# Patient Record
Sex: Female | Born: 1965 | Race: Black or African American | Hispanic: No | Smoking: Never smoker
Health system: Southern US, Community
[De-identification: ages and names within clinical notes are randomized; demographics above are authoritative.]

## PROBLEM LIST (undated history)

## (undated) DIAGNOSIS — D649 Anemia, unspecified: Secondary | ICD-10-CM

## (undated) DIAGNOSIS — T7840XA Allergy, unspecified, initial encounter: Secondary | ICD-10-CM

## (undated) DIAGNOSIS — N390 Urinary tract infection, site not specified: Secondary | ICD-10-CM

## (undated) DIAGNOSIS — K219 Gastro-esophageal reflux disease without esophagitis: Secondary | ICD-10-CM

## (undated) HISTORY — DX: Urinary tract infection, site not specified: N39.0

## (undated) HISTORY — DX: Allergy, unspecified, initial encounter: T78.40XA

## (undated) HISTORY — DX: Anemia, unspecified: D64.9

## (undated) HISTORY — DX: Gastro-esophageal reflux disease without esophagitis: K21.9

## (undated) HISTORY — PX: TUBAL LIGATION: SHX77

---
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
## 2004-12-31 ENCOUNTER — Ambulatory Visit: Payer: Self-pay | Admitting: Internal Medicine

## 2005-01-21 ENCOUNTER — Ambulatory Visit: Payer: Self-pay | Admitting: Internal Medicine

## 2010-10-13 ENCOUNTER — Emergency Department: Payer: Self-pay | Admitting: Emergency Medicine

## 2013-01-23 ENCOUNTER — Emergency Department: Payer: Self-pay | Admitting: Internal Medicine

## 2015-11-25 ENCOUNTER — Ambulatory Visit: Payer: Self-pay

## 2015-12-18 ENCOUNTER — Other Ambulatory Visit: Payer: Self-pay

## 2016-05-11 ENCOUNTER — Ambulatory Visit (INDEPENDENT_AMBULATORY_CARE_PROVIDER_SITE_OTHER): Payer: Commercial Managed Care - HMO | Admitting: Family

## 2016-05-11 ENCOUNTER — Encounter: Payer: Self-pay | Admitting: Family

## 2016-05-11 DIAGNOSIS — Z0000 Encounter for general adult medical examination without abnormal findings: Secondary | ICD-10-CM | POA: Diagnosis not present

## 2016-05-11 NOTE — Progress Notes (Signed)
Subjective:    Patient ID: Denise Kramer, female    DOB: February 21, 1966, 50 y.o.   MRN: [id number]  Chief Complaint  Patient presents with  . Establish Care    Not fasting    HPI:  Denise Kramer is a 50 y.o. female who presents today for an annual wellness visit.   1) Health Maintenance -   Diet - Averages about 3 meals per day with snacks consisting of a regular diet; Caffeine intake of 2-3 cups per day.   Exercise - No structured exercise   2) Preventative Exams / Immunizations:  Dental -- Due to exam  Vision -- Due for exam   Health Maintenance  Topic Date Due  . HIV Screening  03/09/1982  . PAP SMEAR  03/09/1988  . INFLUENZA VACCINE  05/07/2017 (Originally 03/23/2016)  . TETANUS/TDAP  02/13/2026     There is no immunization history on file for this patient.   Allergies  Allergen Reactions  . Aspirin Rash     No outpatient prescriptions prior to visit.   No facility-administered medications prior to visit.      Past Medical History:  Diagnosis Date  . Allergy   . GERD (gastroesophageal reflux disease)   . UTI (urinary tract infection)      Past Surgical History:  Procedure Laterality Date  . TUBAL LIGATION       Family History  Problem Relation Age of Onset  . Alcohol abuse Mother   . Hypertension Mother   . Healthy Father   . Hypertension Maternal Grandmother      Social History   Social History  . Marital status: Divorced    Spouse name: N/A  . Number of children: 3  . Years of education: 12   Occupational History  . Intake Specialist     Social History Main Topics  . Smoking status: Never Smoker  . Smokeless tobacco: Never Used  . Alcohol use No  . Drug use: No  . Sexual activity: Not on file   Other Topics Concern  . Not on file   Social History Narrative   Fun: Eat, shop, travel   Denies abuse and feels safe at home      Review of Systems  Constitutional: Denies fever, chills, fatigue, or significant weight  gain/loss. HENT: Head: Denies headache or neck pain Ears: Denies changes in hearing, ringing in ears, earache, drainage Nose: Denies discharge, stuffiness, itching, nosebleed, sinus pain Throat: Denies sore throat, hoarseness, dry mouth, sores, thrush Eyes: Denies loss/changes in vision, pain, redness, blurry/double vision, flashing lights Cardiovascular: Denies chest pain/discomfort, tightness, palpitations, shortness of breath with activity, difficulty lying down, swelling, sudden awakening with shortness of breath Respiratory: Denies shortness of breath, cough, sputum production, wheezing Gastrointestinal: Denies dysphasia, heartburn, change in appetite, nausea, change in bowel habits, rectal bleeding, constipation, diarrhea, yellow skin or eyes Genitourinary: Denies frequency, urgency, burning/pain, blood in urine, incontinence, change in urinary strength. Musculoskeletal: Denies muscle/joint pain, stiffness, back pain, redness or swelling of joints, trauma Skin: Denies rashes, lumps, itching, dryness, color changes, or hair/nail changes Neurological: Denies dizziness, fainting, seizures, weakness, numbness, tingling, tremor Psychiatric - Denies nervousness, stress, depression or memory loss Endocrine: Denies heat or cold intolerance, sweating, frequent urination, excessive thirst, changes in appetite Hematologic: Denies ease of bruising or bleeding     Objective:    BP 130/88 (BP Location: Left Arm, Patient Position: Sitting, Cuff Size: Normal)   Pulse 85   Temp 98.2 F (36.8 C) (Oral)  Resp 16   Ht 5\' 5"  (1.651 m)   Wt 163 lb (73.9 kg)   SpO2 98%   BMI 27.12 kg/m  Nursing note and vital signs reviewed.  Physical Exam  Constitutional: She is oriented to person, place, and time. She appears well-developed and well-nourished.  HENT:  Head: Normocephalic.  Right Ear: Hearing, tympanic membrane, external ear and ear canal normal.  Left Ear: Hearing, tympanic membrane, external  ear and ear canal normal.  Nose: Nose normal.  Mouth/Throat: Uvula is midline, oropharynx is clear and moist and mucous membranes are normal.  Eyes: Conjunctivae and EOM are normal. Pupils are equal, round, and reactive to light.  Neck: Neck supple. No JVD present. No tracheal deviation present. No thyromegaly present.  Cardiovascular: Normal rate, regular rhythm, normal heart sounds and intact distal pulses.   Pulmonary/Chest: Effort normal and breath sounds normal.  Abdominal: Soft. Bowel sounds are normal. She exhibits no distension and no mass. There is no tenderness. There is no rebound and no guarding.  Musculoskeletal: Normal range of motion. She exhibits no edema or tenderness.  Lymphadenopathy:    She has no cervical adenopathy.  Neurological: She is alert and oriented to person, place, and time. She has normal reflexes. No cranial nerve deficit. She exhibits normal muscle tone. Coordination normal.  Skin: Skin is warm and dry.  Psychiatric: She has a normal mood and affect. Her behavior is normal. Judgment and thought content normal.       Assessment & Plan:   Problem List Items Addressed This Visit      Other   Routine general medical examination at a health care facility    1) Anticipatory Guidance: Discussed importance of wearing a seatbelt while driving and not texting while driving; changing batteries in smoke detector at least once annually; wearing suntan lotion when outside; eating a balanced and moderate diet; getting physical activity at least 30 minutes per day.  2) Immunizations / Screenings / Labs:  Declines influenza. All other immunizations are up-to-date per recommendations. Due for dental and vision screen encouraged to be completed independently. Due for cervical cancer screening with referral to gynecology placed. Obtain vitamin D for vitamin D deficiency screening. All other screenings are up-to-date per recommendations. Obtain CBC, CMET, and lipid profile.      Overall well exam with risk factors for cardiovascular disease including overweight and sedentary lifestyle. Encouraged to in increase physical activity to goal of 30 minutes of moderate level activity daily. Continue other healthy lifestyle behaviors and choices. Follow-up prevention exam in 1 year. Follow-up office visit pending blood work as necessary.       Relevant Orders   CBC   Comprehensive metabolic panel   VITAMIN D 25 Hydroxy (Vit-D Deficiency, Fractures)   Lipid panel   Ambulatory referral to Gynecology    Other Visit Diagnoses   None.     Follow-up: Return if symptoms worsen or fail to improve.   Jeanine Luz, FNP

## 2016-05-11 NOTE — Assessment & Plan Note (Signed)
1) Anticipatory Guidance: Discussed importance of wearing a seatbelt while driving and not texting while driving; changing batteries in smoke detector at least once annually; wearing suntan lotion when outside; eating a balanced and moderate diet; getting physical activity at least 30 minutes per day.  2) Immunizations / Screenings / Labs:  Declines influenza. All other immunizations are up-to-date per recommendations. Due for dental and vision screen encouraged to be completed independently. Due for cervical cancer screening with referral to gynecology placed. Obtain vitamin D for vitamin D deficiency screening. All other screenings are up-to-date per recommendations. Obtain CBC, CMET, and lipid profile.    Overall well exam with risk factors for cardiovascular disease including overweight and sedentary lifestyle. Encouraged to in increase physical activity to goal of 30 minutes of moderate level activity daily. Continue other healthy lifestyle behaviors and choices. Follow-up prevention exam in 1 year. Follow-up office visit pending blood work as necessary.

## 2016-05-11 NOTE — Patient Instructions (Addendum)
Thank you for choosing [facility].  SUMMARY AND INSTRUCTIONS:  Labs:  Please stop by the lab on the lower level of the building for your blood work. Your results will be released to [place] (or called to you) after review, usually within 72 hours after test completion. If any changes need to be made, you will be notified at that same time.  1.) The lab is open from 7:30am to 5:30 pm Monday-Friday 2.) No appointment is necessary 3.) Fasting (if needed) is 6-8 hours after food and drink; black coffee and water are okay   Follow up:  If your symptoms worsen or fail to improve, please contact our office for further instruction, or in case of emergency go directly to the emergency room at the closest medical facility.    Health Maintenance, Female Adopting a healthy lifestyle and getting preventive care can go a long way to promote health and wellness. Talk with your health care provider about what schedule of regular examinations is right for you. This is a good chance for you to check in with your provider about disease prevention and staying healthy. In between checkups, there are plenty of things you can do on your own. Experts have done a lot of research about which lifestyle changes and preventive measures are most likely to keep you healthy. Ask your health care provider for more information. WEIGHT AND DIET  Eat a healthy diet  Be sure to include plenty of vegetables, fruits, low-fat dairy products, and lean protein.  Do not eat a lot of foods high in solid fats, added sugars, or salt.  Get regular exercise. This is one of the most important things you can do for your health.  Most adults should exercise for at least 150 minutes each week. The exercise should increase your heart rate and make you sweat (moderate-intensity exercise).  Most adults should also do strengthening exercises at least twice a week. This is in addition to the moderate-intensity exercise.  Maintain a  healthy weight  Body mass index (BMI) is a measurement that can be used to identify possible weight problems. It estimates body fat based on height and weight. Your health care provider can help determine your BMI and help you achieve or maintain a healthy weight.  For females 66 years of age and older:   A BMI below 18.5 is considered underweight.  A BMI of 18.5 to 24.9 is normal.  A BMI of 25 to 29.9 is considered overweight.  A BMI of 30 and above is considered obese.  Watch levels of cholesterol and blood lipids  You should start having your blood tested for lipids and cholesterol at 49 years of age, then have this test every 5 years.  You may need to have your cholesterol levels checked more often if:  Your lipid or cholesterol levels are high.  You are older than 49 years of age.  You are at high risk for heart disease.  CANCER SCREENING   Lung Cancer  Lung cancer screening is recommended for adults 48-12 years old who are at high risk for lung cancer because of a history of smoking.  A yearly low-dose CT scan of the lungs is recommended for people who:  Currently smoke.  Have quit within the past 15 years.  Have at least a 30-pack-year history of smoking. A pack year is smoking an average of one pack of cigarettes a day for 1 year.  Yearly screening should continue until it has been 15  years since you quit.  Yearly screening should stop if you develop a health problem that would prevent you from having lung cancer treatment.  Breast Cancer  Practice breast self-awareness. This means understanding how your breasts normally appear and feel.  It also means doing regular breast self-exams. Let your health care provider know about any changes, no matter how small.  If you are in your 20s or 30s, you should have a clinical breast exam (CBE) by a health care provider every 1-3 years as part of a regular health exam.  If you are 105 or older, have a CBE every year.  Also consider having a breast X-ray (mammogram) every year.  If you have a family history of breast cancer, talk to your health care provider about genetic screening.  If you are at high risk for breast cancer, talk to your health care provider about having an MRI and a mammogram every year.  Breast cancer gene (BRCA) assessment is recommended for women who have family members with BRCA-related cancers. BRCA-related cancers include:  Breast.  Ovarian.  Tubal.  Peritoneal cancers.  Results of the assessment will determine the need for genetic counseling and BRCA1 and BRCA2 testing. Cervical Cancer Your health care provider may recommend that you be screened regularly for cancer of the pelvic organs (ovaries, uterus, and vagina). This screening involves a pelvic examination, including checking for microscopic changes to the surface of your cervix (Pap test). You may be encouraged to have this screening done every 3 years, beginning at age 4.  For women ages 18-65, health care providers may recommend pelvic exams and Pap testing every 3 years, or they may recommend the Pap and pelvic exam, combined with testing for human papilloma virus (HPV), every 5 years. Some types of HPV increase your risk of cervical cancer. Testing for HPV may also be done on women of any age with unclear Pap test results.  Other health care providers may not recommend any screening for nonpregnant women who are considered low risk for pelvic cancer and who do not have symptoms. Ask your health care provider if a screening pelvic exam is right for you.  If you have had past treatment for cervical cancer or a condition that could lead to cancer, you need Pap tests and screening for cancer for at least 20 years after your treatment. If Pap tests have been discontinued, your risk factors (such as having a new sexual partner) need to be reassessed to determine if screening should resume. Some women have medical problems that  increase the chance of getting cervical cancer. In these cases, your health care provider may recommend more frequent screening and Pap tests. Colorectal Cancer  This type of cancer can be detected and often prevented.  Routine colorectal cancer screening usually begins at 49 years of age and continues through 49 years of age.  Your health care provider may recommend screening at an earlier age if you have risk factors for colon cancer.  Your health care provider may also recommend using home test kits to check for hidden blood in the stool.  A small camera at the end of a tube can be used to examine your colon directly (sigmoidoscopy or colonoscopy). This is done to check for the earliest forms of colorectal cancer.  Routine screening usually begins at age 33.  Direct examination of the colon should be repeated every 5-10 years through 49 years of age. However, you may need to be screened more often if early  forms of precancerous polyps or small growths are found. Skin Cancer  Check your skin from head to toe regularly.  Tell your health care provider about any new moles or changes in moles, especially if there is a change in a mole's shape or color.  Also tell your health care provider if you have a mole that is larger than the size of a pencil eraser.  Always use sunscreen. Apply sunscreen liberally and repeatedly throughout the day.  Protect yourself by wearing long sleeves, pants, a wide-brimmed hat, and sunglasses whenever you are outside. HEART DISEASE, DIABETES, AND HIGH BLOOD PRESSURE   High blood pressure causes heart disease and increases the risk of stroke. High blood pressure is more likely to develop in:  People who have blood pressure in the high end of the normal range (130-139/85-89 mm Hg).  People who are overweight or obese.  People who are African American.  If you are 62-72 years of age, have your blood pressure checked every 3-5 years. If you are 35 years of  age or older, have your blood pressure checked every year. You should have your blood pressure measured twice--once when you are at a hospital or clinic, and once when you are not at a hospital or clinic. Record the average of the two measurements. To check your blood pressure when you are not at a hospital or clinic, you can use:  An automated blood pressure machine at a pharmacy.  A home blood pressure monitor.  If you are between 67 years and 50 years old, ask your health care provider if you should take aspirin to prevent strokes.  Have regular diabetes screenings. This involves taking a blood sample to check your fasting blood sugar level.  If you are at a normal weight and have a low risk for diabetes, have this test once every three years after 49 years of age.  If you are overweight and have a high risk for diabetes, consider being tested at a younger age or more often. PREVENTING INFECTION  Hepatitis B  If you have a higher risk for hepatitis B, you should be screened for this virus. You are considered at high risk for hepatitis B if:  You were born in a country where hepatitis B is common. Ask your health care provider which countries are considered high risk.  Your parents were born in a high-risk country, and you have not been immunized against hepatitis B (hepatitis B vaccine).  You have HIV or AIDS.  You use needles to inject street drugs.  You live with someone who has hepatitis B.  You have had sex with someone who has hepatitis B.  You get hemodialysis treatment.  You take certain medicines for conditions, including cancer, organ transplantation, and autoimmune conditions. Hepatitis C  Blood testing is recommended for:  Everyone born from [birth year] through 1964.  Anyone with known risk factors for hepatitis C. Sexually transmitted infections (STIs)  You should be screened for sexually transmitted infections (STIs) including gonorrhea and chlamydia if:  You are  sexually active and are younger than 49 years of age.  You are older than 49 years of age and your health care provider tells you that you are at risk for this type of infection.  Your sexual activity has changed since you were last screened and you are at an increased risk for chlamydia or gonorrhea. Ask your health care provider if you are at risk.  If you do not have HIV, but are  at risk, it may be recommended that you take a prescription medicine daily to prevent HIV infection. This is called pre-exposure prophylaxis (PrEP). You are considered at risk if:  You are sexually active and do not regularly use condoms or know the HIV status of your partner(s).  You take drugs by injection.  You are sexually active with a partner who has HIV. Talk with your health care provider about whether you are at high risk of being infected with HIV. If you choose to begin PrEP, you should first be tested for HIV. You should then be tested every 3 months for as long as you are taking PrEP.  PREGNANCY   If you are premenopausal and you may become pregnant, ask your health care provider about preconception counseling.  If you may become pregnant, take 400 to 800 micrograms (mcg) of folic acid every day.  If you want to prevent pregnancy, talk to your health care provider about birth control (contraception). OSTEOPOROSIS AND MENOPAUSE   Osteoporosis is a disease in which the bones lose minerals and strength with aging. This can result in serious bone fractures. Your risk for osteoporosis can be identified using a bone density scan.  If you are 25 years of age or older, or if you are at risk for osteoporosis and fractures, ask your health care provider if you should be screened.  Ask your health care provider whether you should take a calcium or vitamin D supplement to lower your risk for osteoporosis.  Menopause may have certain physical symptoms and risks.  Hormone replacement therapy may reduce some  of these symptoms and risks. Talk to your health care provider about whether hormone replacement therapy is right for you.  HOME CARE INSTRUCTIONS   Schedule regular health, dental, and eye exams.  Stay current with your immunizations.   Do not use any tobacco products including cigarettes, chewing tobacco, or electronic cigarettes.  If you are pregnant, do not drink alcohol.  If you are breastfeeding, limit how much and how often you drink alcohol.  Limit alcohol intake to no more than 1 drink per day for nonpregnant women. One drink equals 12 ounces of beer, 5 ounces of wine, or 1 ounces of hard liquor.  Do not use street drugs.  Do not share needles.  Ask your health care provider for help if you need support or information about quitting drugs.  Tell your health care provider if you often feel depressed.  Tell your health care provider if you have ever been abused or do not feel safe at home.   This information is not intended to replace advice given to you by your health care provider. Make sure you discuss any questions you have with your health care provider.   Document Released: 02/22/2011 Document Revised: 08/30/2014 Document Reviewed: 07/11/2013 Elsevier Interactive Patient Education [employer].

## 2016-05-14 ENCOUNTER — Other Ambulatory Visit (INDEPENDENT_AMBULATORY_CARE_PROVIDER_SITE_OTHER): Payer: Commercial Managed Care - HMO

## 2016-05-14 DIAGNOSIS — Z0000 Encounter for general adult medical examination without abnormal findings: Secondary | ICD-10-CM | POA: Diagnosis not present

## 2016-05-14 LAB — COMPREHENSIVE METABOLIC PANEL
ALBUMIN: 3.5 g/dL (ref 3.5–5.2)
ALK PHOS: 58 U/L (ref 39–117)
ALT: 8 U/L (ref 0–35)
AST: 14 U/L (ref 0–37)
BILIRUBIN TOTAL: 0.5 mg/dL (ref 0.2–1.2)
BUN: 13 mg/dL (ref 6–23)
CO2: 30 mEq/L (ref 19–32)
CREATININE: 1.02 mg/dL (ref 0.40–1.20)
Calcium: 8.7 mg/dL (ref 8.4–10.5)
Chloride: 104 mEq/L (ref 96–112)
GFR: 74.02 mL/min (ref >60.00)
GLUCOSE: 79 mg/dL (ref 70–99)
Potassium: 3.8 mEq/L (ref 3.5–5.1)
SODIUM: 140 meq/L (ref 135–145)
TOTAL PROTEIN: 7.2 g/dL (ref 6.0–8.3)

## 2016-05-14 LAB — CBC
HCT: 33.7 % — ABNORMAL LOW (ref 36.0–46.0)
Hemoglobin: 11.5 g/dL — ABNORMAL LOW (ref 12.0–15.0)
MCHC: 34.1 g/dL (ref 30.0–36.0)
MCV: 89 fl (ref 78.0–100.0)
Platelets: 209 10*3/uL (ref 150.0–400.0)
RBC: 3.78 Mil/uL — ABNORMAL LOW (ref 3.87–5.11)
RDW: 14.2 % (ref 11.5–15.5)
WBC: 3.8 10*3/uL — ABNORMAL LOW (ref 4.0–10.5)

## 2016-05-14 LAB — LIPID PANEL
Cholesterol: 178 mg/dL (ref 0–200)
HDL: 57.5 mg/dL (ref >39.00)
LDL Cholesterol: 106 mg/dL — ABNORMAL HIGH (ref 0–99)
NonHDL: 120.17
Total CHOL/HDL Ratio: 3
Triglycerides: 71 mg/dL (ref 0.0–149.0)
VLDL: 14.2 mg/dL (ref 0.0–40.0)

## 2016-05-14 LAB — VITAMIN D 25 HYDROXY (VIT D DEFICIENCY, FRACTURES): VITD: 29.25 ng/mL — ABNORMAL LOW (ref 30.00–100.00)

## 2017-03-01 ENCOUNTER — Other Ambulatory Visit: Payer: Self-pay | Admitting: Adult Health Nurse Practitioner

## 2017-07-22 ENCOUNTER — Other Ambulatory Visit (INDEPENDENT_AMBULATORY_CARE_PROVIDER_SITE_OTHER): Payer: 59

## 2017-07-22 ENCOUNTER — Encounter: Payer: Self-pay | Admitting: Nurse Practitioner

## 2017-07-22 ENCOUNTER — Encounter: Payer: Self-pay | Admitting: Gastroenterology

## 2017-07-22 ENCOUNTER — Ambulatory Visit (INDEPENDENT_AMBULATORY_CARE_PROVIDER_SITE_OTHER): Payer: 59 | Admitting: Nurse Practitioner

## 2017-07-22 VITALS — BP 128/88 | HR 60 | Temp 98.1°F | Resp 14 | Ht 65.0 in | Wt 170.0 lb

## 2017-07-22 DIAGNOSIS — Z13220 Encounter for screening for lipoid disorders: Secondary | ICD-10-CM

## 2017-07-22 DIAGNOSIS — Z1211 Encounter for screening for malignant neoplasm of colon: Secondary | ICD-10-CM

## 2017-07-22 DIAGNOSIS — Z114 Encounter for screening for human immunodeficiency virus [HIV]: Secondary | ICD-10-CM | POA: Diagnosis not present

## 2017-07-22 DIAGNOSIS — Z0000 Encounter for general adult medical examination without abnormal findings: Secondary | ICD-10-CM

## 2017-07-22 DIAGNOSIS — Z1231 Encounter for screening mammogram for malignant neoplasm of breast: Secondary | ICD-10-CM

## 2017-07-22 DIAGNOSIS — D649 Anemia, unspecified: Secondary | ICD-10-CM

## 2017-07-22 DIAGNOSIS — Z1239 Encounter for other screening for malignant neoplasm of breast: Secondary | ICD-10-CM

## 2017-07-22 LAB — LIPID PANEL
CHOLESTEROL: 206 mg/dL — AB (ref 0–200)
HDL: 56.8 mg/dL (ref >39.00)
LDL Cholesterol: 134 mg/dL — ABNORMAL HIGH (ref 0–99)
NONHDL: 149.29
Total CHOL/HDL Ratio: 4
Triglycerides: 75 mg/dL (ref 0.0–149.0)
VLDL: 15 mg/dL (ref 0.0–40.0)

## 2017-07-22 LAB — CBC
HCT: 35.1 % — ABNORMAL LOW (ref 36.0–46.0)
Hemoglobin: 11.8 g/dL — ABNORMAL LOW (ref 12.0–15.0)
MCHC: 33.7 g/dL (ref 30.0–36.0)
MCV: 89.6 fl (ref 78.0–100.0)
Platelets: 207 10*3/uL (ref 150.0–400.0)
RBC: 3.92 Mil/uL (ref 3.87–5.11)
RDW: 14 % (ref 11.5–15.5)
WBC: 4.3 10*3/uL (ref 4.0–10.5)

## 2017-07-22 NOTE — Patient Instructions (Addendum)
Please head downstairs for lab work.  I have placed a referral for colonoscopy and mammograms. Our office will call you to schedule this appointment. You should hear from our office in 7-10 days.  Please work on your diet and exercise as we discussed. Remember half of your plate should be veggies, one-fourth carbs, one-fourth meat, and don't eat meat at every meal. Also, remember to stay away from sugary drinks. I'd like for you to start incorporating exercise into your daily schedule. Start at 10 minutes a day, working up to 30 minutes five times a week.   Ill see you back in 1 year, or sooner if you need me.  It was nice to meet you. Thanks for letting me take care of you today :)  Preventive Care 40-64 Years, Female Preventive care refers to lifestyle choices and visits with your health care provider that can promote health and wellness. What does preventive care include?  A yearly physical exam. This is also called an annual well check.  Dental exams once or twice a year.  Routine eye exams. Ask your health care provider how often you should have your eyes checked.  Personal lifestyle choices, including: ? Daily care of your teeth and gums. ? Regular physical activity. ? Eating a healthy diet. ? Avoiding tobacco and drug use. ? Limiting alcohol use. ? Practicing safe sex. ? Taking low-dose aspirin daily starting at age 61. ? Taking vitamin and mineral supplements as recommended by your health care provider. What happens during an annual well check? The services and screenings done by your health care provider during your annual well check will depend on your age, overall health, lifestyle risk factors, and family history of disease. Counseling Your health care provider may ask you questions about your:  Alcohol use.  Tobacco use.  Drug use.  Emotional well-being.  Home and relationship well-being.  Sexual activity.  Eating habits.  Work and work  Statistician.  Method of birth control.  Menstrual cycle.  Pregnancy history.  Screening You may have the following tests or measurements:  Height, weight, and BMI.  Blood pressure.  Lipid and cholesterol levels. These may be checked every 5 years, or more frequently if you are over 38 years old.  Skin check.  Lung cancer screening. You may have this screening every year starting at age 5 if you have a 30-pack-year history of smoking and currently smoke or have quit within the past 15 years.  Fecal occult blood test (FOBT) of the stool. You may have this test every year starting at age 8.  Flexible sigmoidoscopy or colonoscopy. You may have a sigmoidoscopy every 5 years or a colonoscopy every 10 years starting at age 85.  Hepatitis C blood test.  Hepatitis B blood test.  Sexually transmitted disease (STD) testing.  Diabetes screening. This is done by checking your blood sugar (glucose) after you have not eaten for a while (fasting). You may have this done every 1-3 years.  Mammogram. This may be done every 1-2 years. Talk to your health care provider about when you should start having regular mammograms. This may depend on whether you have a family history of breast cancer.  BRCA-related cancer screening. This may be done if you have a family history of breast, ovarian, tubal, or peritoneal cancers.  Pelvic exam and Pap test. This may be done every 3 years starting at age 26. Starting at age 55, this may be done every 5 years if you have a Pap  test in combination with an HPV test.  Bone density scan. This is done to screen for osteoporosis. You may have this scan if you are at high risk for osteoporosis.  Discuss your test results, treatment options, and if necessary, the need for more tests with your health care provider. Vaccines Your health care provider may recommend certain vaccines, such as:  Influenza vaccine. This is recommended every year.  Tetanus,  diphtheria, and acellular pertussis (Tdap, Td) vaccine. You may need a Td booster every 10 years.  Varicella vaccine. You may need this if you have not been vaccinated.  Zoster vaccine. You may need this after age 8.  Measles, mumps, and rubella (MMR) vaccine. You may need at least one dose of MMR if you were born in 1957 or later. You may also need a second dose.  Pneumococcal 13-valent conjugate (PCV13) vaccine. You may need this if you have certain conditions and were not previously vaccinated.  Pneumococcal polysaccharide (PPSV23) vaccine. You may need one or two doses if you smoke cigarettes or if you have certain conditions.  Meningococcal vaccine. You may need this if you have certain conditions.  Hepatitis A vaccine. You may need this if you have certain conditions or if you travel or work in places where you may be exposed to hepatitis A.  Hepatitis B vaccine. You may need this if you have certain conditions or if you travel or work in places where you may be exposed to hepatitis B.  Haemophilus influenzae type b (Hib) vaccine. You may need this if you have certain conditions.  Talk to your health care provider about which screenings and vaccines you need and how often you need them. This information is not intended to replace advice given to you by your health care provider. Make sure you discuss any questions you have with your health care provider. Document Released: 09/05/2015 Document Revised: 04/28/2016 Document Reviewed: 06/10/2015 Elsevier Interactive Patient Education  2017 [employer].

## 2017-07-22 NOTE — Progress Notes (Signed)
Subjective:    Patient ID: Denise Kramer, female    DOB: Oct 11, 1965, 51 y.o.   MRN: [id number]  HPI Denise Kramer is a 51 yo female who presents today to establish care. She is transferring to me from another provider in the same clinic. Patient presents today for complete physical.  Immunizations: Flu-up to date TDAP- up to date Colonoscopy: referral today Pap Smear: follows with GYN Mammogram: referral today Smoker: never Vision: annual Dental: annual Diet: Breakfast-biscuit, cookie Lunch- eats lunch out Dinner-cooks at home Veggies almost days; Fruit daily Drinks green tea, soda, some water Exercise: Does not exercise at gym Sunscreen sometimes seatbelt yes  Review of Systems  Constitutional: Negative.  Negative for activity change and appetite change.  HENT: Negative for dental problem and voice change.   Eyes: Negative for visual disturbance.  Respiratory: Negative for cough and shortness of breath.   Cardiovascular: Negative for chest pain and palpitations.  Gastrointestinal: Negative for constipation and diarrhea.  Endocrine: Negative for cold intolerance and heat intolerance.  Genitourinary: Negative for difficulty urinating and hematuria.  Musculoskeletal: Negative for arthralgias and myalgias.  Skin: Negative for rash.  Allergic/Immunologic: Negative for environmental allergies and food allergies.  Neurological: Negative for speech difficulty and headaches.  Hematological: Does not bruise/bleed easily.  Psychiatric/Behavioral:       Negative for depression or anxiety.    Past Medical History:  Diagnosis Date  . Allergy   . GERD (gastroesophageal reflux disease)   . UTI (urinary tract infection)      Social History   Socioeconomic History  . Marital status: Divorced    Spouse name: Not on file  . Number of children: 3  . Years of education: 2212  . Highest education level: Not on file  Social Needs  . Financial resource strain: Not on file  . Food  insecurity - worry: Not on file  . Food insecurity - inability: Not on file  . Transportation needs - medical: Not on file  . Transportation needs - non-medical: Not on file  Occupational History  . Occupation: Intake Specialist   Tobacco Use  . Smoking status: Never Smoker  . Smokeless tobacco: Never Used  Substance and Sexual Activity  . Alcohol use: No  . Drug use: No  . Sexual activity: Not on file  Other Topics Concern  . Not on file  Social History Narrative   Fun: Eat, shop, travel   Denies abuse and feels safe at home    Past Surgical History:  Procedure Laterality Date  . TUBAL LIGATION      Family History  Problem Relation Age of Onset  . Alcohol abuse Mother   . Hypertension Mother   . Healthy Father   . Hypertension Maternal Grandmother     Allergies  Allergen Reactions  . Aspirin Rash    Current Outpatient Medications on File Prior to Visit  Medication Sig Dispense Refill  . Multiple Vitamin (MULTIVITAMIN) tablet Take 1 tablet by mouth daily.     No current facility-administered medications on file prior to visit.        Objective:   Physical Exam BP 128/88 (BP Location: Left Arm, Patient Position: Sitting, Cuff Size: Normal)   Pulse 60   Temp 98.1 F (36.7 C) (Oral)   Resp 14   Ht 5\' 5"  (1.651 m)   Wt 170 lb (77.1 kg)   BMI 28.29 kg/m   General Appearance:    Alert, cooperative, no distress, appears stated age  Head:    Normocephalic, without obvious abnormality, atraumatic  Eyes:    PERRL, conjunctiva/corneas clear, EOM's intact  Ears:    Normal right TM and external ear canal, left TM: mid ear effusion, normal left external ear canal   Nose:   Nares normal, septum midline, mucosa normal, no drainage    or sinus tenderness  Throat:   Lips, mucosa, and tongue normal; teeth and gums normal  Neck:   Supple, symmetrical, trachea midline, no adenopathy;    thyroid:  no enlargement/tenderness/nodules  Back:     Symmetric, no curvature, ROM  normal, no CVA tenderness  Lungs:     Clear to auscultation bilaterally, respirations unlabored  Chest Wall:    No tenderness or deformity   Heart:    Regular rate and rhythm, heart sounds normal, no murmur     Abdomen:     Soft, non-tender, bowel sounds active all four quadrants,    no masses, no organomegaly        Extremities:   Extremities normal, atraumatic, no cyanosis or edema  Pulses:   2+ and symmetric all extremities  Skin:   Skin color, texture, turgor normal, no rashes or lesions  Lymph nodes:   Cervical, supraclavicular, and axillary nodes normal  Neurologic:   CNII-XII intact, normal strength, sensation and reflexes    Throughout Psychiatric: normal mood, behavior, judgement         Assessment & Plan:

## 2017-07-22 NOTE — Assessment & Plan Note (Signed)
Anemia, unspecified type-maintained on OTC multivitamin with past labs indicating mild anemia. CBC today

## 2017-07-22 NOTE — Assessment & Plan Note (Signed)
Screening for HIV (human immunodeficiency virus) HIV antibody today Screening for colon cancer Ambulatory referral to Gastroenterology Screening for breast cancer MM DIGITAL SCREENING BILATERAL; Future Immunizations up to date. Follows GYN for pap. Health maintenance up to date.  Healthy diet and exercise discussed including reduced meat and 1/2 plate of veggies at meals, getting 150 minutes of exercise per week. Sunscreen, seatbelt dicussed  Screening cholesterol level - Lipid panel today  Preventive care handout given today

## 2017-07-23 LAB — HIV ANTIBODY (ROUTINE TESTING W REFLEX): HIV: NONREACTIVE

## 2017-08-24 ENCOUNTER — Ambulatory Visit
Admission: RE | Admit: 2017-08-24 | Discharge: 2017-08-24 | Disposition: A | Discharge status: Home / Self Care | Payer: 59 | Source: Ambulatory Visit | Attending: Nurse Practitioner | Admitting: Nurse Practitioner

## 2017-08-24 DIAGNOSIS — Z0000 Encounter for general adult medical examination without abnormal findings: Secondary | ICD-10-CM

## 2017-08-24 DIAGNOSIS — Z1239 Encounter for other screening for malignant neoplasm of breast: Secondary | ICD-10-CM

## 2017-09-01 ENCOUNTER — Ambulatory Visit (AMBULATORY_SURGERY_CENTER): Payer: Self-pay | Admitting: *Deleted

## 2017-09-01 ENCOUNTER — Other Ambulatory Visit: Payer: Self-pay

## 2017-09-01 VITALS — Ht 64.0 in | Wt 169.0 lb

## 2017-09-01 DIAGNOSIS — Z1211 Encounter for screening for malignant neoplasm of colon: Secondary | ICD-10-CM

## 2017-09-01 MED ORDER — SUPREP BOWEL PREP KIT 17.5-3.13-1.6 GM/177ML PO SOLN: 1.0000 | Freq: Once | ORAL | 0 refills | Status: AC

## 2017-09-01 NOTE — Progress Notes (Signed)
Patient denies any allergies to egg or soy products. Patient denies complications with anesthesia/sedation.  Patient denies oxygen use at home and denies diet medications.  Pamphlet given for colonoscopy. 

## 2017-09-07 ENCOUNTER — Encounter: Payer: Self-pay | Admitting: Gastroenterology

## 2017-09-15 ENCOUNTER — Encounter: Payer: Self-pay | Admitting: Gastroenterology

## 2017-09-15 ENCOUNTER — Other Ambulatory Visit: Payer: Self-pay

## 2017-09-15 ENCOUNTER — Ambulatory Visit (AMBULATORY_SURGERY_CENTER): Payer: 59 | Admitting: Gastroenterology

## 2017-09-15 VITALS — BP 119/87 | HR 72 | Temp 98.6°F | Resp 12 | Ht 64.0 in | Wt 169.0 lb

## 2017-09-15 DIAGNOSIS — Z1211 Encounter for screening for malignant neoplasm of colon: Secondary | ICD-10-CM | POA: Diagnosis not present

## 2017-09-15 DIAGNOSIS — Z1212 Encounter for screening for malignant neoplasm of rectum: Secondary | ICD-10-CM | POA: Diagnosis not present

## 2017-09-15 MED ORDER — SODIUM CHLORIDE 0.9 % IV SOLN: 500.0000 mL | Freq: Once | INTRAVENOUS | Status: AC

## 2017-09-15 NOTE — Progress Notes (Signed)
Report given to PACU, vss 

## 2017-09-15 NOTE — Progress Notes (Signed)
No soy or egg allegyPt's states no medical or surgical changes since previsit or office visit 

## 2017-09-15 NOTE — Patient Instructions (Signed)
Discharge instructions given. Normal exam. Resume previous medications. YOU HAD AN ENDOSCOPIC PROCEDURE TODAY AT THE [facility]:   Refer to the procedure report that was given to you for any specific questions about what was found during the examination.  If the procedure report does not answer your questions, please call your gastroenterologist to clarify.  If you requested that your care partner not be given the details of your procedure findings, then the procedure report has been included in a sealed envelope for you to review at your convenience later.  YOU SHOULD EXPECT: Some feelings of bloating in the abdomen. Passage of more gas than usual.  Walking can help get rid of the air that was put into your GI tract during the procedure and reduce the bloating. If you had a lower endoscopy (such as a colonoscopy or flexible sigmoidoscopy) you may notice spotting of blood in your stool or on the toilet paper. If you underwent a bowel prep for your procedure, you may not have a normal bowel movement for a few days.  Please Note:  You might notice some irritation and congestion in your nose or some drainage.  This is from the oxygen used during your procedure.  There is no need for concern and it should clear up in a day or so.  SYMPTOMS TO REPORT IMMEDIATELY:   Following lower endoscopy (colonoscopy or flexible sigmoidoscopy):  Excessive amounts of blood in the stool  Significant tenderness or worsening of abdominal pains  Swelling of the abdomen that is new, acute  Fever of 100F or higher   For urgent or emergent issues, a gastroenterologist can be reached at any hour by calling [phone].   DIET:  We do recommend a small meal at first, but then you may proceed to your regular diet.  Drink plenty of fluids but you should avoid alcoholic beverages for 24 hours.  ACTIVITY:  You should plan to take it easy for the rest of today and you should NOT DRIVE or use heavy machinery  until tomorrow (because of the sedation medicines used during the test).    FOLLOW UP: Our staff will call the number listed on your records the next business day following your procedure to check on you and address any questions or concerns that you may have regarding the information given to you following your procedure. If we do not reach you, we will leave a message.  However, if you are feeling well and you are not experiencing any problems, there is no need to return our call.  We will assume that you have returned to your regular daily activities without incident.  If any biopsies were taken you will be contacted by phone or by letter within the next 1-3 weeks.  Please call us at [phone] if you have not heard about the biopsies in 3 weeks.    SIGNATURES/CONFIDENTIALITY: You and/or your care partner have signed paperwork which will be entered into your electronic medical record.  These signatures attest to the fact that that the information above on your After Visit Summary has been reviewed and is understood.  Full responsibility of the confidentiality of this discharge information lies with you and/or your care-partner. 

## 2017-09-15 NOTE — Op Note (Signed)
[facility] Patient Name: Denise Kramer Procedure Date: 09/15/2017 11:08 AM MRN: [id number] Endoscopist: Sherilyn CooterHenry L. Myrtie Neitheranis , MD Age: [age] Referring MD:  Date of Birth: 01/01/1966 Gender: Female Account #: [id number] Procedure:                Colonoscopy Indications:              Screening for colorectal malignant neoplasm, This                            is the patient's first colonoscopy Medicines:                Monitored Anesthesia Care Procedure:                Pre-Anesthesia Assessment:                           - Prior to the procedure, a History and Physical                            was performed, and patient medications and                            allergies were reviewed. The patient's tolerance of                            previous anesthesia was also reviewed. The risks                            and benefits of the procedure and the sedation                            options and risks were discussed with the patient.                            All questions were answered, and informed consent                            was obtained. Prior Anticoagulants: The patient has                            taken no previous anticoagulant or antiplatelet                            agents. ASA Grade Assessment: I - A normal, healthy                            patient. After reviewing the risks and benefits,                            the patient was deemed in satisfactory condition to                            undergo the procedure.  After obtaining informed consent, the colonoscope                            was passed under direct vision. Throughout the                            procedure, the patient's blood pressure, pulse, and                            oxygen saturations were monitored continuously. The                            Colonoscope was introduced through the anus and                            advanced to the the cecum, identified  by                            appendiceal orifice and ileocecal valve. The                            colonoscopy was performed without difficulty. The                            patient tolerated the procedure well. The quality                            of the bowel preparation was excellent. The                            ileocecal valve, appendiceal orifice, and rectum                            were photographed. The quality of the bowel                            preparation was evaluated using the BBPS [facility]                            Bowel Preparation Scale) with scores of: Right                            Colon = 3, Transverse Colon = 3 and Left Colon = 3                            (entire mucosa seen well with no residual staining,                            small fragments of stool or opaque liquid). The                            total BBPS score equals 9. The bowel preparation  used was SUPREP. Scope In: 11:12:12 AM Scope Out: 11:25:06 AM Scope Withdrawal Time: 0 hours 10 minutes 0 seconds  Total Procedure Duration: 0 hours 12 minutes 54 seconds  Findings:                 The perianal and digital rectal examinations were                            normal.                           The entire examined colon appeared normal.                           Retroflexion in the rectum was not performed due to                            anatomy. Complications:            No immediate complications. Estimated Blood Loss:     Estimated blood loss: none. Impression:               - The entire examined colon is normal.                           - No specimens collected. Recommendation:           - Patient has a contact number available for                            emergencies. The signs and symptoms of potential                            delayed complications were discussed with the                            patient. Return to normal activities tomorrow.                             Written discharge instructions were provided to the                            patient.                           - Resume previous diet.                           - Continue present medications.                           - Repeat colonoscopy in 10 years for screening                            purposes. Maxden Naji L. Myrtie Neither, MD 09/15/2017 11:27:50 AM This report has been signed electronically.

## 2017-09-16 ENCOUNTER — Telehealth: Payer: Self-pay

## 2017-09-16 NOTE — Telephone Encounter (Signed)
  Follow up Call-  Call back number 09/15/2017  Post procedure Call Back phone  # [phone]  Permission to leave phone message Yes  Some recent data might be hidden     Patient questions:  Do you have a fever, pain , or abdominal swelling? No. Pain Score  0 *  Have you tolerated food without any problems? Yes.    Have you been able to return to your normal activities? Yes.    Do you have any questions about your discharge instructions: Diet   No. Medications  No. Follow up visit  No.  Do you have questions or concerns about your Care? No.  Actions: * If pain score is 4 or above: No action needed, pain <4.

## 2018-05-24 ENCOUNTER — Other Ambulatory Visit (INDEPENDENT_AMBULATORY_CARE_PROVIDER_SITE_OTHER): Payer: 59

## 2018-05-24 ENCOUNTER — Ambulatory Visit: Payer: 59 | Admitting: Nurse Practitioner

## 2018-05-24 ENCOUNTER — Encounter: Payer: Self-pay | Admitting: Nurse Practitioner

## 2018-05-24 VITALS — BP 142/88 | HR 65 | Ht 64.0 in | Wt 171.0 lb

## 2018-05-24 DIAGNOSIS — E785 Hyperlipidemia, unspecified: Secondary | ICD-10-CM

## 2018-05-24 DIAGNOSIS — R030 Elevated blood-pressure reading, without diagnosis of hypertension: Secondary | ICD-10-CM | POA: Diagnosis not present

## 2018-05-24 DIAGNOSIS — Z23 Encounter for immunization: Secondary | ICD-10-CM | POA: Diagnosis not present

## 2018-05-24 LAB — TSH: TSH: 0.97 u[IU]/mL (ref 0.35–4.50)

## 2018-05-24 LAB — COMPREHENSIVE METABOLIC PANEL
ALT: 10 U/L (ref 0–35)
AST: 15 U/L (ref 0–37)
Albumin: 4 g/dL (ref 3.5–5.2)
Alkaline Phosphatase: 91 U/L (ref 39–117)
BILIRUBIN TOTAL: 0.5 mg/dL (ref 0.2–1.2)
BUN: 13 mg/dL (ref 6–23)
CHLORIDE: 103 meq/L (ref 96–112)
CO2: 31 mEq/L (ref 19–32)
CREATININE: 1.03 mg/dL (ref 0.40–1.20)
Calcium: 9.3 mg/dL (ref 8.4–10.5)
GFR: 72.59 mL/min (ref >60.00)
Glucose, Bld: 90 mg/dL (ref 70–99)
Potassium: 3.7 mEq/L (ref 3.5–5.1)
Sodium: 140 mEq/L (ref 135–145)
TOTAL PROTEIN: 7.8 g/dL (ref 6.0–8.3)

## 2018-05-24 LAB — LIPID PANEL
CHOLESTEROL: 183 mg/dL (ref 0–200)
HDL: 49.2 mg/dL (ref >39.00)
LDL Cholesterol: 116 mg/dL — ABNORMAL HIGH (ref 0–99)
NonHDL: 133.99
Total CHOL/HDL Ratio: 4
Triglycerides: 89 mg/dL (ref 0.0–149.0)
VLDL: 17.8 mg/dL (ref 0.0–40.0)

## 2018-05-24 NOTE — Assessment & Plan Note (Signed)
She is fasting Update labs F/U with further recommendations pending lab results - Lipid panel; Future

## 2018-05-24 NOTE — Patient Instructions (Addendum)
Please head downstairs for lab work. If any of your test results are critically abnormal, you will be contacted right away. Otherwise, I will contact you within a week about your test results and follow up recommendations  Please try to check your blood pressure once daily or at least a few times a week, at the same time each day, and keep a log. Please return in about 1 month with BP readings. The goal is for your blood pressure to be less than 140/90 at all times   DASH Eating Plan DASH stands for "Dietary Approaches to Stop Hypertension." The DASH eating plan is a healthy eating plan that has been shown to reduce high blood pressure (hypertension). It may also reduce your risk for type 2 diabetes, heart disease, and stroke. The DASH eating plan may also help with weight loss. What are tips for following this plan? General guidelines  Avoid eating more than 2,300 mg (milligrams) of salt (sodium) a day. If you have hypertension, you may need to reduce your sodium intake to 1,500 mg a day.  Limit alcohol intake to no more than 1 drink a day for nonpregnant women and 2 drinks a day for men. One drink equals 12 oz of beer, 5 oz of wine, or 1 oz of hard liquor.  Work with your health care provider to maintain a healthy body weight or to lose weight. Ask what an ideal weight is for you.  Get at least 30 minutes of exercise that causes your heart to beat faster (aerobic exercise) most days of the week. Activities may include walking, swimming, or biking.  Work with your health care provider or diet and nutrition specialist (dietitian) to adjust your eating plan to your individual calorie needs. Reading food labels  Check food labels for the amount of sodium per serving. Choose foods with less than 5 percent of the Daily Value of sodium. Generally, foods with less than 300 mg of sodium per serving fit into this eating plan.  To find whole grains, look for the word "whole" as the first word in  the ingredient list. Shopping  Buy products labeled as "low-sodium" or "no salt added."  Buy fresh foods. Avoid canned foods and premade or frozen meals. Cooking  Avoid adding salt when cooking. Use salt-free seasonings or herbs instead of table salt or sea salt. Check with your health care provider or pharmacist before using salt substitutes.  Do not fry foods. Cook foods using healthy methods such as baking, boiling, grilling, and broiling instead.  Cook with heart-healthy oils, such as olive, canola, soybean, or sunflower oil. Meal planning   Eat a balanced diet that includes: ? 5 or more servings of fruits and vegetables each day. At each meal, try to fill half of your plate with fruits and vegetables. ? Up to 6-8 servings of whole grains each day. ? Less than 6 oz of lean meat, poultry, or fish each day. A 3-oz serving of meat is about the same size as a deck of cards. One egg equals 1 oz. ? 2 servings of low-fat dairy each day. ? A serving of nuts, seeds, or beans 5 times each week. ? Heart-healthy fats. Healthy fats called Omega-3 fatty acids are found in foods such as flaxseeds and coldwater fish, like sardines, salmon, and mackerel.  Limit how much you eat of the following: ? Canned or prepackaged foods. ? Food that is high in trans fat, such as fried foods. ? Food that is high  in saturated fat, such as fatty meat. ? Sweets, desserts, sugary drinks, and other foods with added sugar. ? Full-fat dairy products.  Do not salt foods before eating.  Try to eat at least 2 vegetarian meals each week.  Eat more home-cooked food and less restaurant, buffet, and fast food.  When eating at a restaurant, ask that your food be prepared with less salt or no salt, if possible. What foods are recommended? The items listed may not be a complete list. Talk with your dietitian about what dietary choices are best for you. Grains Whole-grain or whole-wheat bread. Whole-grain or  whole-wheat pasta. Brown rice. [place]. Bulgur. Whole-grain and low-sodium cereals. Pita bread. Low-fat, low-sodium crackers. Whole-wheat flour tortillas. Vegetables Fresh or frozen vegetables (raw, steamed, roasted, or grilled). Low-sodium or reduced-sodium tomato and vegetable juice. Low-sodium or reduced-sodium tomato sauce and tomato paste. Low-sodium or reduced-sodium canned vegetables. Fruits All fresh, dried, or frozen fruit. Canned fruit in natural juice (without added sugar). Meat and other protein foods Skinless chicken or [place]. Ground chicken or [place]. Pork with fat trimmed off. Fish and seafood. Egg whites. Dried beans, peas, or lentils. Unsalted nuts, nut butters, and seeds. Unsalted canned beans. Lean cuts of beef with fat trimmed off. Low-sodium, lean deli meat. Dairy Low-fat (1%) or fat-free (skim) milk. Fat-free, low-fat, or reduced-fat cheeses. Nonfat, low-sodium ricotta or cottage cheese. Low-fat or nonfat yogurt. Low-fat, low-sodium cheese. Fats and oils Soft margarine without trans fats. Vegetable oil. Low-fat, reduced-fat, or light mayonnaise and salad dressings (reduced-sodium). Canola, safflower, olive, soybean, and sunflower oils. Avocado. Seasoning and other foods Herbs. Spices. Seasoning mixes without salt. Unsalted popcorn and pretzels. Fat-free sweets. What foods are not recommended? The items listed may not be a complete list. Talk with your dietitian about what dietary choices are best for you. Grains Baked goods made with fat, such as croissants, muffins, or some breads. Dry pasta or rice meal packs. Vegetables Creamed or fried vegetables. Vegetables in a cheese sauce. Regular canned vegetables (not low-sodium or reduced-sodium). Regular canned tomato sauce and paste (not low-sodium or reduced-sodium). Regular tomato and vegetable juice (not low-sodium or reduced-sodium). Angie Fava. Olives. Fruits Canned fruit in a light or heavy syrup. Fried fruit. Fruit  in cream or butter sauce. Meat and other protein foods Fatty cuts of meat. Ribs. Fried meat. Berniece Salines. Sausage. Bologna and other processed lunch meats. Salami. Fatback. Hotdogs. Bratwurst. Salted nuts and seeds. Canned beans with added salt. Canned or smoked fish. Whole eggs or egg yolks. Chicken or [place] with skin. Dairy Whole or 2% milk, cream, and half-and-half. Whole or full-fat cream cheese. Whole-fat or sweetened yogurt. Full-fat cheese. Nondairy creamers. Whipped toppings. Processed cheese and cheese spreads. Fats and oils Butter. Stick margarine. Lard. Shortening. Ghee. Bacon fat. Tropical oils, such as coconut, palm kernel, or palm oil. Seasoning and other foods Salted popcorn and pretzels. Onion salt, garlic salt, seasoned salt, table salt, and sea salt. Worcestershire sauce. Tartar sauce. Barbecue sauce. Teriyaki sauce. Soy sauce, including reduced-sodium. Steak sauce. Canned and packaged gravies. Fish sauce. Oyster sauce. Cocktail sauce. Horseradish that you find on the shelf. Ketchup. Mustard. Meat flavorings and tenderizers. Bouillon cubes. Hot sauce and Tabasco sauce. Premade or packaged marinades. Premade or packaged taco seasonings. Relishes. Regular salad dressings. Where to find more information:  National Heart, Lung, and [place]: https://wilson-eaton.com/  American Heart Association: www.heart.org Summary  The DASH eating plan is a healthy eating plan that has been shown to reduce high blood pressure (hypertension). It may  also reduce your risk for type 2 diabetes, heart disease, and stroke.  With the DASH eating plan, you should limit salt (sodium) intake to 2,300 mg a day. If you have hypertension, you may need to reduce your sodium intake to 1,500 mg a day.  When on the DASH eating plan, aim to eat more fresh fruits and vegetables, whole grains, lean proteins, low-fat dairy, and heart-healthy fats.  Work with your health care provider or diet and nutrition specialist  (dietitian) to adjust your eating plan to your individual calorie needs. This information is not intended to replace advice given to you by your health care provider. Make sure you discuss any questions you have with your health care provider. Document Released: 07/29/2011 Document Revised: 08/02/2016 Document Reviewed: 08/02/2016 Elsevier Interactive Patient Education  [employer].

## 2018-05-24 NOTE — Progress Notes (Signed)
Name: Denise Kramer   MRN: [id number]    DOB: 1966-03-13   Date:05/24/2018       Progress Note  Subjective  Chief Complaint Follow up  HPI Denise Kramer is here today for follow up of elevated BP readings and slightly elevated cholesterol, noted at CPE on 07/22/17. No new medications were started, I asked her to return in about 3 months to repeat lipid panel, BP reading, but she did not return until today. She tells me she has not really been working on diet or exercise, does not watch what she eats at all, does not regularly check her BP readings, and in fact has gained a few pounds. She overall feels well, denies syncope, weakness, headaches, vision changes, chest pain, shortness of breath, edema. She really does not want to start any new medications today, thinking about trying to work on diet and exercise now that her BP readings are more elevated.  BP Readings from Last 3 Encounters:  05/24/18 (!) 142/88  09/15/17 119/87  07/22/17 128/88   The 10-year ASCVD risk score Denman George DC Jr., et al., 2013) is: 3.1%   Values used to calculate the score:     Age: 52 years     Sex: Female     Is Non-Hispanic African American: Yes     Diabetic: No     Tobacco smoker: No     Systolic Blood Pressure: 142 mmHg     Is BP treated: No     HDL Cholesterol: 56.8 mg/dL     Total Cholesterol: 206 mg/dL      Patient Active Problem List   Diagnosis Date Noted  . Anemia 07/22/2017  . Routine general medical examination at a health care facility 05/11/2016    Past Surgical History:  Procedure Laterality Date  . TUBAL LIGATION      Family History  Problem Relation Age of Onset  . Alcohol abuse Mother   . Hypertension Mother   . Healthy Father   . Hypertension Maternal Grandmother   . Colon cancer Neg Hx   . Colon polyps Neg Hx   . Rectal cancer Neg Hx   . Stomach cancer Neg Hx     Social History   Socioeconomic History  . Marital status: Divorced    Spouse name: Not on file  . Number of  children: 3  . Years of education: 30  . Highest education level: Not on file  Occupational History  . Occupation: Intake Specialist   Social Needs  . Financial resource strain: Not on file  . Food insecurity:    Worry: Not on file    Inability: Not on file  . Transportation needs:    Medical: Not on file    Non-medical: Not on file  Tobacco Use  . Smoking status: Never Smoker  . Smokeless tobacco: Never Used  Substance and Sexual Activity  . Alcohol use: Yes    Comment: occasional wine  . Drug use: No  . Sexual activity: Not on file  Lifestyle  . Physical activity:    Days per week: Not on file    Minutes per session: Not on file  . Stress: Not on file  Relationships  . Social connections:    Talks on phone: Not on file    Gets together: Not on file    Attends religious service: Not on file    Active member of club or organization: Not on file    Attends meetings of clubs or  organizations: Not on file    Relationship status: Not on file  . Intimate partner violence:    Fear of current or ex partner: Not on file    Emotionally abused: Not on file    Physically abused: Not on file    Forced sexual activity: Not on file  Other Topics Concern  . Not on file  Social History Narrative   Fun: Eat, shop, travel   Denies abuse and feels safe at home     Current Outpatient Medications:  .  albuterol (PROVENTIL HFA;VENTOLIN HFA) 108 (90 Base) MCG/ACT inhaler, Inhale 2 puffs into the lungs 4 (four) times daily as needed., Disp: , Rfl:  .  Multiple Vitamin (MULTIVITAMIN) tablet, Take 1 tablet by mouth daily., Disp: , Rfl:  .  OVER THE COUNTER MEDICATION, Take 1 tablet by mouth daily as needed (allergy). [facility]  Allergy medicaion, Disp: , Rfl:   Current Facility-Administered Medications:  .  0.9 %  sodium chloride infusion, 500 mL, Intravenous, Once, Danis, Andreas Blower, MD  Allergies  Allergen Reactions  . Aspirin Rash     ROS See HPI  Objective  Vitals:    05/24/18 0929 05/24/18 1010  BP: (!) 136/92 (!) 142/88  Pulse: 65   SpO2: 96%   Weight: 171 lb (77.6 kg)   Height: 5\' 4"  (1.626 m)     Body mass index is 29.35 kg/m.  Physical Exam Vital signs reviewed. Constitutional: Patient appears well-developed and well-nourished. No distress.  HENT: Head: Normocephalic and atraumatic.  Nose: Nose normal. Mouth/Throat: Oropharynx is clear and moist. No oropharyngeal exudate.  Eyes: Conjunctivae and EOM are normal. Pupils are equal, round, and reactive to light. No scleral icterus.  Neck: Normal range of motion. Neck supple. No thyromegaly present.  Cardiovascular: Normal rate, regular rhythm and normal heart sounds.  No murmur heard. No BLE edema. Distal pulses intact. Pulmonary/Chest: Effort normal and breath sounds normal. No respiratory distress. Neurological: She is alert and oriented to person, place, and time. No cranial nerve deficit. Coordination, balance, strength, speech and gait are normal.  Skin: Skin is warm and dry. No rash noted. No erythema.  Psychiatric: Patient has a normal mood and affect. behavior is normal. Judgment and thought content normal.   Assessment & Plan RTC in 1 month for F/U: HTN- recheck BP, home log  -Reviewed Health Maintenance:  Need for influenza vaccination- Flu Vaccine QUAD 36+ mos IM  Elevated blood pressure reading BP reading remains slightly elevated today She is hesitant to start any medication Discussed the long term risks of elevated BP including heart and kidney disease, discussed role of healthy diet and exercise in the management of HTN, she was also encouraged to monitor BP readings at home, additional education provided on AVS She will work on diet, exercise for 1 month and RTC for F/U-will recommend antihypertensives if BP remains elevated at next F/U, which we discussed today - Comprehensive metabolic panel; Future - TSH; Future - Lipid panel; Future

## 2018-06-28 ENCOUNTER — Encounter: Payer: Self-pay | Admitting: Nurse Practitioner

## 2018-06-28 ENCOUNTER — Ambulatory Visit: Payer: 59 | Admitting: Nurse Practitioner

## 2018-06-28 VITALS — BP 130/90 | HR 71 | Ht 64.0 in | Wt 170.0 lb

## 2018-06-28 DIAGNOSIS — R030 Elevated blood-pressure reading, without diagnosis of hypertension: Secondary | ICD-10-CM | POA: Diagnosis not present

## 2018-06-28 NOTE — Patient Instructions (Signed)
Please try to check your blood pressure once daily or at least a few times a week, at the same time each day, and keep a log. Please return in 1 month with your log Blood pressure goal is less than 140/90   DASH Eating Plan DASH stands for "Dietary Approaches to Stop Hypertension." The DASH eating plan is a healthy eating plan that has been shown to reduce high blood pressure (hypertension). It may also reduce your risk for type 2 diabetes, heart disease, and stroke. The DASH eating plan may also help with weight loss. What are tips for following this plan? General guidelines  Avoid eating more than 2,300 mg (milligrams) of salt (sodium) a day. If you have hypertension, you may need to reduce your sodium intake to 1,500 mg a day.  Limit alcohol intake to no more than 1 drink a day for nonpregnant women and 2 drinks a day for men. One drink equals 12 oz of beer, 5 oz of wine, or 1 oz of hard liquor.  Work with your health care provider to maintain a healthy body weight or to lose weight. Ask what an ideal weight is for you.  Get at least 30 minutes of exercise that causes your heart to beat faster (aerobic exercise) most days of the week. Activities may include walking, swimming, or biking.  Work with your health care provider or diet and nutrition specialist (dietitian) to adjust your eating plan to your individual calorie needs. Reading food labels  Check food labels for the amount of sodium per serving. Choose foods with less than 5 percent of the Daily Value of sodium. Generally, foods with less than 300 mg of sodium per serving fit into this eating plan.  To find whole grains, look for the word "whole" as the first word in the ingredient list. Shopping  Buy products labeled as "low-sodium" or "no salt added."  Buy fresh foods. Avoid canned foods and premade or frozen meals. Cooking  Avoid adding salt when cooking. Use salt-free seasonings or herbs instead of table salt or sea  salt. Check with your health care provider or pharmacist before using salt substitutes.  Do not fry foods. Cook foods using healthy methods such as baking, boiling, grilling, and broiling instead.  Cook with heart-healthy oils, such as olive, canola, soybean, or sunflower oil. Meal planning   Eat a balanced diet that includes: ? 5 or more servings of fruits and vegetables each day. At each meal, try to fill half of your plate with fruits and vegetables. ? Up to 6-8 servings of whole grains each day. ? Less than 6 oz of lean meat, poultry, or fish each day. A 3-oz serving of meat is about the same size as a deck of cards. One egg equals 1 oz. ? 2 servings of low-fat dairy each day. ? A serving of nuts, seeds, or beans 5 times each week. ? Heart-healthy fats. Healthy fats called Omega-3 fatty acids are found in foods such as flaxseeds and coldwater fish, like sardines, salmon, and mackerel.  Limit how much you eat of the following: ? Canned or prepackaged foods. ? Food that is high in trans fat, such as fried foods. ? Food that is high in saturated fat, such as fatty meat. ? Sweets, desserts, sugary drinks, and other foods with added sugar. ? Full-fat dairy products.  Do not salt foods before eating.  Try to eat at least 2 vegetarian meals each week.  Eat more home-cooked food and less  restaurant, buffet, and fast food.  When eating at a restaurant, ask that your food be prepared with less salt or no salt, if possible. What foods are recommended? The items listed may not be a complete list. Talk with your dietitian about what dietary choices are best for you. Grains Whole-grain or whole-wheat bread. Whole-grain or whole-wheat pasta. Brown rice. [place]. Bulgur. Whole-grain and low-sodium cereals. Pita bread. Low-fat, low-sodium crackers. Whole-wheat flour tortillas. Vegetables Fresh or frozen vegetables (raw, steamed, roasted, or grilled). Low-sodium or reduced-sodium tomato  and vegetable juice. Low-sodium or reduced-sodium tomato sauce and tomato paste. Low-sodium or reduced-sodium canned vegetables. Fruits All fresh, dried, or frozen fruit. Canned fruit in natural juice (without added sugar). Meat and other protein foods Skinless chicken or [place]. Ground chicken or [place]. Pork with fat trimmed off. Fish and seafood. Egg whites. Dried beans, peas, or lentils. Unsalted nuts, nut butters, and seeds. Unsalted canned beans. Lean cuts of beef with fat trimmed off. Low-sodium, lean deli meat. Dairy Low-fat (1%) or fat-free (skim) milk. Fat-free, low-fat, or reduced-fat cheeses. Nonfat, low-sodium ricotta or cottage cheese. Low-fat or nonfat yogurt. Low-fat, low-sodium cheese. Fats and oils Soft margarine without trans fats. Vegetable oil. Low-fat, reduced-fat, or light mayonnaise and salad dressings (reduced-sodium). Canola, safflower, olive, soybean, and sunflower oils. Avocado. Seasoning and other foods Herbs. Spices. Seasoning mixes without salt. Unsalted popcorn and pretzels. Fat-free sweets. What foods are not recommended? The items listed may not be a complete list. Talk with your dietitian about what dietary choices are best for you. Grains Baked goods made with fat, such as croissants, muffins, or some breads. Dry pasta or rice meal packs. Vegetables Creamed or fried vegetables. Vegetables in a cheese sauce. Regular canned vegetables (not low-sodium or reduced-sodium). Regular canned tomato sauce and paste (not low-sodium or reduced-sodium). Regular tomato and vegetable juice (not low-sodium or reduced-sodium). Angie Fava. Olives. Fruits Canned fruit in a light or heavy syrup. Fried fruit. Fruit in cream or butter sauce. Meat and other protein foods Fatty cuts of meat. Ribs. Fried meat. Berniece Salines. Sausage. Bologna and other processed lunch meats. Salami. Fatback. Hotdogs. Bratwurst. Salted nuts and seeds. Canned beans with added salt. Canned or smoked fish. Whole eggs  or egg yolks. Chicken or [place] with skin. Dairy Whole or 2% milk, cream, and half-and-half. Whole or full-fat cream cheese. Whole-fat or sweetened yogurt. Full-fat cheese. Nondairy creamers. Whipped toppings. Processed cheese and cheese spreads. Fats and oils Butter. Stick margarine. Lard. Shortening. Ghee. Bacon fat. Tropical oils, such as coconut, palm kernel, or palm oil. Seasoning and other foods Salted popcorn and pretzels. Onion salt, garlic salt, seasoned salt, table salt, and sea salt. Worcestershire sauce. Tartar sauce. Barbecue sauce. Teriyaki sauce. Soy sauce, including reduced-sodium. Steak sauce. Canned and packaged gravies. Fish sauce. Oyster sauce. Cocktail sauce. Horseradish that you find on the shelf. Ketchup. Mustard. Meat flavorings and tenderizers. Bouillon cubes. Hot sauce and Tabasco sauce. Premade or packaged marinades. Premade or packaged taco seasonings. Relishes. Regular salad dressings. Where to find more information:  National Heart, Lung, and [place]: https://wilson-eaton.com/  American Heart Association: www.heart.org Summary  The DASH eating plan is a healthy eating plan that has been shown to reduce high blood pressure (hypertension). It may also reduce your risk for type 2 diabetes, heart disease, and stroke.  With the DASH eating plan, you should limit salt (sodium) intake to 2,300 mg a day. If you have hypertension, you may need to reduce your sodium intake to 1,500 mg a day.  When on the DASH eating plan, aim to eat more fresh fruits and vegetables, whole grains, lean proteins, low-fat dairy, and heart-healthy fats.  Work with your health care provider or diet and nutrition specialist (dietitian) to adjust your eating plan to your individual calorie needs. This information is not intended to replace advice given to you by your health care provider. Make sure you discuss any questions you have with your health care provider. Document Released: 07/29/2011  Document Revised: 08/02/2016 Document Reviewed: 08/02/2016 Elsevier Interactive Patient Education  [employer].

## 2018-06-28 NOTE — Progress Notes (Signed)
Denise Kramer is a 52 y.o. female with the following history as recorded in [employer]:  Patient Active Problem List   Diagnosis Date Noted  . Hyperlipidemia 05/24/2018  . Anemia 07/22/2017  . Routine general medical examination at a health care facility 05/11/2016    Current Outpatient Medications  Medication Sig Dispense Refill  . albuterol (PROVENTIL HFA;VENTOLIN HFA) 108 (90 Base) MCG/ACT inhaler Inhale 2 puffs into the lungs 4 (four) times daily as needed.    . Multiple Vitamin (MULTIVITAMIN) tablet Take 1 tablet by mouth daily.    Marland Kitchen OVER THE COUNTER MEDICATION Take 1 tablet by mouth daily as needed (allergy). [facility]  Allergy medicaion     Current Facility-Administered Medications  Medication Dose Route Frequency Provider Last Rate Last Dose  . 0.9 %  sodium chloride infusion  500 mL Intravenous Once Sherrilyn Rist, MD        Allergies: Aspirin  Past Medical History:  Diagnosis Date  . Allergy   . Anemia 1985   hx after child birth, no problems since  . GERD (gastroesophageal reflux disease)    diet controlled   . SVD (spontaneous vaginal delivery)    x 3  . UTI (urinary tract infection)     Past Surgical History:  Procedure Laterality Date  . TUBAL LIGATION      Family History  Problem Relation Age of Onset  . Alcohol abuse Mother   . Hypertension Mother   . Healthy Father   . Hypertension Maternal Grandmother   . Colon cancer Neg Hx   . Colon polyps Neg Hx   . Rectal cancer Neg Hx   . Stomach cancer Neg Hx     Social History   Tobacco Use  . Smoking status: Never Smoker  . Smokeless tobacco: Never Used  Substance Use Topics  . Alcohol use: Yes    Comment: occasional wine     Subjective:  Denise Kramer here today for HTN follow up, she is not maintained on any medications. Her blood pressure has been slightly elevated at recent OVs but she wanted to try to improve her diet and exercise prior to starting any new medications. Since her last OV on 05/24/18,  she has joined [employer], walking on treadmill about 3 days a week and has stopped eating pork. She does not check BP readings at home. She feels well today, Denies weakness, headaches, vision changes, chest pain, shortness of breath, edema.  BP Readings from Last 3 Encounters:  06/28/18 130/90  05/24/18 (!) 142/88  09/15/17 119/87    ROS- See HPI  Objective:  Vitals:   06/28/18 0925 06/28/18 0950  BP: (!) 148/100 130/90  Pulse: 71   SpO2: 99%   Weight: 170 lb (77.1 kg)   Height: 5\' 4"  (1.626 m)     General: Well developed, well nourished, in no acute distress  Skin : Warm and dry.  Head: Normocephalic and atraumatic  Eyes: Sclera and conjunctiva clear; pupils round and reactive to light; extraocular movements intact  Oropharynx: Pink, supple. No suspicious lesions  Neck: Supple without thyromegaly, adenopathy  Lungs: Respirations unlabored CVS exam: normal rate and regular rhythm.  Extremities: No edema, cyanosis  Vessels: Symmetric bilaterally  Neurologic: Alert and oriented; speech intact; face symmetrical; moves all extremities well; CNII-XII intact without focal deficit  Psychiatric: Normal mood and affect.   Assessment:  1. Elevated blood pressure reading     Plan:   Return in about 1 month (around 07/28/2018)  for BP f/u- recheck BP and home log.  No orders of the defined types were placed in this encounter.   Requested Prescriptions    No prescriptions requested or ordered in this encounter    Elevated blood pressure reading BP remains slightly elevated, again today we discussed the long term risks of elevated BP, starting daily bp medication, and she again tells me she does not want to start a daily medication She will continue to work on healthy diet and exercise and is agreeable to begin monitoring BP readings at home RTC in 1 month with home BP log, BP recheck, will continue to recommend starting daily medication at next OV if readings remain  elevated Additional education provided on AVS

## 2018-07-24 ENCOUNTER — Encounter: Payer: 59 | Admitting: Nurse Practitioner

## 2018-07-28 ENCOUNTER — Ambulatory Visit (INDEPENDENT_AMBULATORY_CARE_PROVIDER_SITE_OTHER): Payer: 59 | Admitting: Nurse Practitioner

## 2018-07-28 ENCOUNTER — Encounter: Payer: Self-pay | Admitting: Nurse Practitioner

## 2018-07-28 VITALS — BP 130/100 | HR 82 | Ht 64.0 in | Wt 171.0 lb

## 2018-07-28 DIAGNOSIS — Z0000 Encounter for general adult medical examination without abnormal findings: Secondary | ICD-10-CM | POA: Diagnosis not present

## 2018-07-28 DIAGNOSIS — R030 Elevated blood-pressure reading, without diagnosis of hypertension: Secondary | ICD-10-CM

## 2018-07-28 NOTE — Patient Instructions (Signed)
Labs downstairs  Please try to check your blood pressure once daily or at least a few times a week, at the same time each day, and keep a log. Follow up for blood pressure readings over 140/90 at home   Hypertension Hypertension is another name for high blood pressure. High blood pressure forces your heart to work harder to pump blood. This can cause problems over time. There are two numbers in a blood pressure reading. There is a top number (systolic) over a bottom number (diastolic). It is best to have a blood pressure below 120/80. Healthy choices can help lower your blood pressure. You may need medicine to help lower your blood pressure if:  Your blood pressure cannot be lowered with healthy choices.  Your blood pressure is higher than 130/80.  Follow these instructions at home: Eating and drinking  If directed, follow the DASH eating plan. This diet includes: ? Filling half of your plate at each meal with fruits and vegetables. ? Filling one quarter of your plate at each meal with whole grains. Whole grains include whole wheat pasta, brown rice, and whole grain bread. ? Eating or drinking low-fat dairy products, such as skim milk or low-fat yogurt. ? Filling one quarter of your plate at each meal with low-fat (lean) proteins. Low-fat proteins include fish, skinless chicken, eggs, beans, and tofu. ? Avoiding fatty meat, cured and processed meat, or chicken with skin. ? Avoiding premade or processed food.  Eat less than 1,500 mg of salt (sodium) a day.  Limit alcohol use to no more than 1 drink a day for nonpregnant women and 2 drinks a day for men. One drink equals 12 oz of beer, 5 oz of wine, or 1 oz of hard liquor. Lifestyle  Work with your doctor to stay at a healthy weight or to lose weight. Ask your doctor what the best weight is for you.  Get at least 30 minutes of exercise that causes your heart to beat faster (aerobic exercise) most days of the week. This may include  walking, swimming, or biking.  Get at least 30 minutes of exercise that strengthens your muscles (resistance exercise) at least 3 days a week. This may include lifting weights or pilates.  Do not use any products that contain nicotine or tobacco. This includes cigarettes and e-cigarettes. If you need help quitting, ask your doctor.  Check your blood pressure at home as told by your doctor.  Keep all follow-up visits as told by your doctor. This is important. Medicines  Take over-the-counter and prescription medicines only as told by your doctor. Follow directions carefully.  Do not skip doses of blood pressure medicine. The medicine does not work as well if you skip doses. Skipping doses also puts you at risk for problems.  Ask your doctor about side effects or reactions to medicines that you should watch for. Contact a doctor if:  You think you are having a reaction to the medicine you are taking.  You have headaches that keep coming back (recurring).  You feel dizzy.  You have swelling in your ankles.  You have trouble with your vision. Get help right away if:  You get a very bad headache.  You start to feel confused.  You feel weak or numb.  You feel faint.  You get very bad pain in your: ? Chest. ? Belly (abdomen).  You throw up (vomit) more than once.  You have trouble breathing. Summary  Hypertension is another name for high  blood pressure.  Making healthy choices can help lower blood pressure. If your blood pressure cannot be controlled with healthy choices, you may need to take medicine. This information is not intended to replace advice given to you by your health care provider. Make sure you discuss any questions you have with your health care provider. Document Released: 01/26/2008 Document Revised: 07/07/2016 Document Reviewed: 07/07/2016 Elsevier Interactive Patient Education  2018 [place] Maintenance, Female Adopting a healthy lifestyle  and getting preventive care can go a long way to promote health and wellness. Talk with your health care provider about what schedule of regular examinations is right for you. This is a good chance for you to check in with your provider about disease prevention and staying healthy. In between checkups, there are plenty of things you can do on your own. Experts have done a lot of research about which lifestyle changes and preventive measures are most likely to keep you healthy. Ask your health care provider for more information. Weight and diet Eat a healthy diet  Be sure to include plenty of vegetables, fruits, low-fat dairy products, and lean protein.  Do not eat a lot of foods high in solid fats, added sugars, or salt.  Get regular exercise. This is one of the most important things you can do for your health. ? Most adults should exercise for at least 150 minutes each week. The exercise should increase your heart rate and make you sweat (moderate-intensity exercise). ? Most adults should also do strengthening exercises at least twice a week. This is in addition to the moderate-intensity exercise.  Maintain a healthy weight  Body mass index (BMI) is a measurement that can be used to identify possible weight problems. It estimates body fat based on height and weight. Your health care provider can help determine your BMI and help you achieve or maintain a healthy weight.  For females 84 years of age and older: ? A BMI below 18.5 is considered underweight. ? A BMI of 18.5 to 24.9 is normal. ? A BMI of 25 to 29.9 is considered overweight. ? A BMI of 30 and above is considered obese.  Watch levels of cholesterol and blood lipids  You should start having your blood tested for lipids and cholesterol at 51 years of age, then have this test every 5 years.  You may need to have your cholesterol levels checked more often if: ? Your lipid or cholesterol levels are high. ? You are older than 51  years of age. ? You are at high risk for heart disease.  Cancer screening Lung Cancer  Lung cancer screening is recommended for adults 59-69 years old who are at high risk for lung cancer because of a history of smoking.  A yearly low-dose CT scan of the lungs is recommended for people who: ? Currently smoke. ? Have quit within the past 15 years. ? Have at least a 30-pack-year history of smoking. A pack year is smoking an average of one pack of cigarettes a day for 1 year.  Yearly screening should continue until it has been 15 years since you quit.  Yearly screening should stop if you develop a health problem that would prevent you from having lung cancer treatment.  Breast Cancer  Practice breast self-awareness. This means understanding how your breasts normally appear and feel.  It also means doing regular breast self-exams. Let your health care provider know about any changes, no matter how small.  If you are in  your 20s or 30s, you should have a clinical breast exam (CBE) by a health care provider every 1-3 years as part of a regular health exam.  If you are 73 or older, have a CBE every year. Also consider having a breast X-ray (mammogram) every year.  If you have a family history of breast cancer, talk to your health care provider about genetic screening.  If you are at high risk for breast cancer, talk to your health care provider about having an MRI and a mammogram every year.  Breast cancer gene (BRCA) assessment is recommended for women who have family members with BRCA-related cancers. BRCA-related cancers include: ? Breast. ? Ovarian. ? Tubal. ? Peritoneal cancers.  Results of the assessment will determine the need for genetic counseling and BRCA1 and BRCA2 testing.  Cervical Cancer Your health care provider may recommend that you be screened regularly for cancer of the pelvic organs (ovaries, uterus, and vagina). This screening involves a pelvic examination,  including checking for microscopic changes to the surface of your cervix (Pap test). You may be encouraged to have this screening done every 3 years, beginning at age 60.  For women ages 64-65, health care providers may recommend pelvic exams and Pap testing every 3 years, or they may recommend the Pap and pelvic exam, combined with testing for human papilloma virus (HPV), every 5 years. Some types of HPV increase your risk of cervical cancer. Testing for HPV may also be done on women of any age with unclear Pap test results.  Other health care providers may not recommend any screening for nonpregnant women who are considered low risk for pelvic cancer and who do not have symptoms. Ask your health care provider if a screening pelvic exam is right for you.  If you have had past treatment for cervical cancer or a condition that could lead to cancer, you need Pap tests and screening for cancer for at least 20 years after your treatment. If Pap tests have been discontinued, your risk factors (such as having a new sexual partner) need to be reassessed to determine if screening should resume. Some women have medical problems that increase the chance of getting cervical cancer. In these cases, your health care provider may recommend more frequent screening and Pap tests.  Colorectal Cancer  This type of cancer can be detected and often prevented.  Routine colorectal cancer screening usually begins at 51 years of age and continues through 51 years of age.  Your health care provider may recommend screening at an earlier age if you have risk factors for colon cancer.  Your health care provider may also recommend using home test kits to check for hidden blood in the stool.  A small camera at the end of a tube can be used to examine your colon directly (sigmoidoscopy or colonoscopy). This is done to check for the earliest forms of colorectal cancer.  Routine screening usually begins at age 39.  Direct  examination of the colon should be repeated every 5-10 years through 52 years of age. However, you may need to be screened more often if early forms of precancerous polyps or small growths are found.  Skin Cancer  Check your skin from head to toe regularly.  Tell your health care provider about any new moles or changes in moles, especially if there is a change in a mole's shape or color.  Also tell your health care provider if you have a mole that is larger than the size  of a pencil eraser.  Always use sunscreen. Apply sunscreen liberally and repeatedly throughout the day.  Protect yourself by wearing long sleeves, pants, a wide-brimmed hat, and sunglasses whenever you are outside.  Heart disease, diabetes, and high blood pressure  High blood pressure causes heart disease and increases the risk of stroke. High blood pressure is more likely to develop in: ? People who have blood pressure in the high end of the normal range (130-139/85-89 mm Hg). ? People who are overweight or obese. ? People who are African American.  If you are 56-75 years of age, have your blood pressure checked every 3-5 years. If you are 67 years of age or older, have your blood pressure checked every year. You should have your blood pressure measured twice-once when you are at a hospital or clinic, and once when you are not at a hospital or clinic. Record the average of the two measurements. To check your blood pressure when you are not at a hospital or clinic, you can use: ? An automated blood pressure machine at a pharmacy. ? A home blood pressure monitor.  If you are between 72 years and 30 years old, ask your health care provider if you should take aspirin to prevent strokes.  Have regular diabetes screenings. This involves taking a blood sample to check your fasting blood sugar level. ? If you are at a normal weight and have a low risk for diabetes, have this test once every three years after 51 years of  age. ? If you are overweight and have a high risk for diabetes, consider being tested at a younger age or more often. Preventing infection Hepatitis B  If you have a higher risk for hepatitis B, you should be screened for this virus. You are considered at high risk for hepatitis B if: ? You were born in a country where hepatitis B is common. Ask your health care provider which countries are considered high risk. ? Your parents were born in a high-risk country, and you have not been immunized against hepatitis B (hepatitis B vaccine). ? You have HIV or AIDS. ? You use needles to inject street drugs. ? You live with someone who has hepatitis B. ? You have had sex with someone who has hepatitis B. ? You get hemodialysis treatment. ? You take certain medicines for conditions, including cancer, organ transplantation, and autoimmune conditions.  Hepatitis C  Blood testing is recommended for: ? Everyone born from [birth year] through 1964. ? Anyone with known risk factors for hepatitis C.  Sexually transmitted infections (STIs)  You should be screened for sexually transmitted infections (STIs) including gonorrhea and chlamydia if: ? You are sexually active and are younger than 51 years of age. ? You are older than 51 years of age and your health care provider tells you that you are at risk for this type of infection. ? Your sexual activity has changed since you were last screened and you are at an increased risk for chlamydia or gonorrhea. Ask your health care provider if you are at risk.  If you do not have HIV, but are at risk, it may be recommended that you take a prescription medicine daily to prevent HIV infection. This is called pre-exposure prophylaxis (PrEP). You are considered at risk if: ? You are sexually active and do not regularly use condoms or know the HIV status of your partner(s). ? You take drugs by injection. ? You are sexually active with a partner who has  HIV.  Talk with your  health care provider about whether you are at high risk of being infected with HIV. If you choose to begin PrEP, you should first be tested for HIV. You should then be tested every 3 months for as long as you are taking PrEP. Pregnancy  If you are premenopausal and you may become pregnant, ask your health care provider about preconception counseling.  If you may become pregnant, take 400 to 800 micrograms (mcg) of folic acid every day.  If you want to prevent pregnancy, talk to your health care provider about birth control (contraception). Osteoporosis and menopause  Osteoporosis is a disease in which the bones lose minerals and strength with aging. This can result in serious bone fractures. Your risk for osteoporosis can be identified using a bone density scan.  If you are 72 years of age or older, or if you are at risk for osteoporosis and fractures, ask your health care provider if you should be screened.  Ask your health care provider whether you should take a calcium or vitamin D supplement to lower your risk for osteoporosis.  Menopause may have certain physical symptoms and risks.  Hormone replacement therapy may reduce some of these symptoms and risks. Talk to your health care provider about whether hormone replacement therapy is right for you. Follow these instructions at home:  Schedule regular health, dental, and eye exams.  Stay current with your immunizations.  Do not use any tobacco products including cigarettes, chewing tobacco, or electronic cigarettes.  If you are pregnant, do not drink alcohol.  If you are breastfeeding, limit how much and how often you drink alcohol.  Limit alcohol intake to no more than 1 drink per day for nonpregnant women. One drink equals 12 ounces of beer, 5 ounces of wine, or 1 ounces of hard liquor.  Do not use street drugs.  Do not share needles.  Ask your health care provider for help if you need support or information about quitting  drugs.  Tell your health care provider if you often feel depressed.  Tell your health care provider if you have ever been abused or do not feel safe at home. This information is not intended to replace advice given to you by your health care provider. Make sure you discuss any questions you have with your health care provider. Document Released: 02/22/2011 Document Revised: 01/15/2016 Document Reviewed: 05/13/2015 Elsevier Interactive Patient Education  [employer].

## 2018-07-28 NOTE — Progress Notes (Signed)
Denise Kramer is a 52 y.o. female with the following history as recorded in Epic[employer]:  Patient Active Problem List   Diagnosis Date Noted  . Hyperlipidemia 05/24/2018  . Anemia 07/22/2017  . Routine general medical examination at a health care facility 05/11/2016    Current Outpatient Medications  Medication Sig Dispense Refill  . albuterol (PROVENTIL HFA;VENTOLIN HFA) 108 (90 Base) MCG/ACT inhaler Inhale 2 puffs into the lungs 4 (four) times daily as needed.    . Multiple Vitamin (MULTIVITAMIN) tablet Take 1 tablet by mouth daily.    Marland Kitchen OVER THE COUNTER MEDICATION Take 1 tablet by mouth daily as needed (allergy). [facility]  Allergy medicaion     Current Facility-Administered Medications  Medication Dose Route Frequency Provider Last Rate Last Dose  . 0.9 %  sodium chloride infusion  500 mL Intravenous Once Sherrilyn Rist, MD        Allergies: Aspirin  Past Medical History:  Diagnosis Date  . Allergy   . Anemia 1985   hx after child birth, no problems since  . GERD (gastroesophageal reflux disease)    diet controlled   . SVD (spontaneous vaginal delivery)    x 3  . UTI (urinary tract infection)     Past Surgical History:  Procedure Laterality Date  . TUBAL LIGATION      Family History  Problem Relation Age of Onset  . Alcohol abuse Mother   . Hypertension Mother   . Healthy Father   . Hypertension Maternal Grandmother   . Colon cancer Neg Hx   . Colon polyps Neg Hx   . Rectal cancer Neg Hx   . Stomach cancer Neg Hx     Social History   Tobacco Use  . Smoking status: Never Smoker  . Smokeless tobacco: Never Used  Substance Use Topics  . Alcohol use: Yes    Comment: occasional wine     Subjective:  She is here today for CPE, Bp recheck.  Last dental exam: 2018 Last vision exam: 2018 PAP: up to date, gyn No LMP recorded. Patient is postmenopausal. Mammogram- up to date Colonoscopy: up to date Lipids: up to date  DM screening- not  indicated Vaccinations: up to date  Hypertension -not maintained on any medications, has been working on healthy diet and exercise at home, at her last visit on 06/28/18 she told me she had joined the gym and stopped eating pork, and although her BP was still elevated she did not want to start daily medications but told me she would log her BP at home and come back in 1 month Today, she returns with one blood pressure reading, taken at her gynecologists office last month: 124/84, otherwise did not check bp readings at home.  BP Readings from Last 3 Encounters:  07/28/18 (!) 130/100  06/28/18 130/90  05/24/18 (!) 142/88    Review of Systems  Constitutional: Negative for chills, fever and weight loss.  HENT: Negative for hearing loss.   Eyes: Negative for blurred vision and double vision.  Respiratory: Negative for shortness of breath.   Cardiovascular: Negative for chest pain and palpitations.  Gastrointestinal: Negative for abdominal pain, constipation, diarrhea, heartburn, nausea and vomiting.  Genitourinary: Negative for dysuria and hematuria.  Musculoskeletal: Negative for falls.  Skin: Negative for rash.  Neurological: Negative for dizziness, loss of consciousness and weakness.  Endo/Heme/Allergies: Does not bruise/bleed easily.  Psychiatric/Behavioral: Negative for depression. The patient is not nervous/anxious.    Objective:  Vitals:  07/28/18 1003 07/28/18 1103  BP: (!) 148/100 (!) 130/100  Pulse: 82   SpO2: 94%   Weight: 171 lb (77.6 kg)   Height: 5\' 4"  (1.626 m)     General: Well developed, well nourished, in no acute distress  Skin : Warm and dry.  Head: Normocephalic and atraumatic  Eyes: Sclera and conjunctiva clear; pupils round and reactive to light; extraocular movements intact  Ears: External normal; canals clear; tympanic membranes normal  Oropharynx: Pink, supple. No suspicious lesions  Neck: Supple without thyromegaly, adenopathy  Lungs: Respirations  unlabored; clear to auscultation bilaterally without wheeze, rales, rhonchi  CVS exam: normal rate, regular rhythm, normal S1, S2, no murmurs, rubs, clicks or gallops.  Abdomen: Soft; nontender; nondistended; no masses or hepatosplenomegaly  Musculoskeletal: No deformities; no active joint inflammation  Extremities: No edema, cyanosis, clubbing  Vessels: Symmetric bilaterally  Neurologic: Alert and oriented; speech intact; face symmetrical; moves all extremities well; CNII-XII intact without focal deficit  Psychiatric: Normal mood and affect.  Assessment:  1. Routine general medical examination at a health care facility   2. Elevated blood pressure reading     Plan:   Return in about 3 months (around 10/27/2018) for F/U: htn- recheck BP, home logs.  Orders Placed This Encounter  Procedures  . CBC    Standing Status:   Future    Standing Expiration Date:   07/29/2019    Requested Prescriptions    No prescriptions requested or ordered in this encounter    Elevated blood pressure reading Again today we discussed the long term risks of uncontrolled BP- MI, CVA, she does not want to start blood pressure medication at this time Recommended again to start BP log at home, RTC in 1 month with log She says she does not want to return in 1 month but will start to monitor BP readings and notify for readings >14090 RTC in 3 months for F/U Home management, red flags and return precautions including when to seek immediate care discussed and printed on AVS - CBC; Future

## 2018-07-28 NOTE — Assessment & Plan Note (Signed)
Reviewed annual screening exams, healthy lifestyle, additional information provided on AVS 05/24/18 CMET, TSH, Lipid panel WNL - CBC; Future

## 2018-10-27 ENCOUNTER — Ambulatory Visit: Payer: 59 | Admitting: Nurse Practitioner

## 2019-09-12 ENCOUNTER — Encounter: Payer: Self-pay | Admitting: Family

## 2019-09-12 ENCOUNTER — Ambulatory Visit (INDEPENDENT_AMBULATORY_CARE_PROVIDER_SITE_OTHER): Payer: 59 | Admitting: Family

## 2019-09-12 ENCOUNTER — Other Ambulatory Visit: Payer: Self-pay

## 2019-09-12 VITALS — Ht 64.0 in | Wt 176.0 lb

## 2019-09-12 DIAGNOSIS — Z1239 Encounter for other screening for malignant neoplasm of breast: Secondary | ICD-10-CM | POA: Insufficient documentation

## 2019-09-12 DIAGNOSIS — R030 Elevated blood-pressure reading, without diagnosis of hypertension: Secondary | ICD-10-CM | POA: Diagnosis not present

## 2019-09-12 DIAGNOSIS — T7840XA Allergy, unspecified, initial encounter: Secondary | ICD-10-CM

## 2019-09-12 DIAGNOSIS — Z1231 Encounter for screening mammogram for malignant neoplasm of breast: Secondary | ICD-10-CM | POA: Diagnosis not present

## 2019-09-12 DIAGNOSIS — Z0000 Encounter for general adult medical examination without abnormal findings: Secondary | ICD-10-CM

## 2019-09-12 NOTE — Progress Notes (Signed)
Virtual Visit via Video Note  I connected with@  on 09/12/19 at 10:00 AM EST by a video enabled telemedicine application and verified that I am speaking with the correct person using two identifiers.  Location patient: home Location provider:work  Persons participating in the virtual visit: patient, provider  I discussed the limitations of evaluation and management by telemedicine and the availability of in person appointments. The patient expressed understanding and agreed to proceed.   HPI:  Had PCP in [place] whom has recently left. Feels well today. No complaints.  H/o elevated blood pressure.  Has never been treated for this. no BP machine at home.  No CP, SOB.   Follows with Dr Lynnette Caffe[facility]  H/o of allergies. Doesn't need 'or ever use inhaler'. No h/o asthma. H/o allergies and takes zyrtec with resolve. Pollen is a trigger for congestion. No wheezing, cough, SOB.   UTD colonscopy  ROS: See pertinent positives and negatives per HPI.  Past Medical History:  Diagnosis Date  . Allergy   . Anemia 1985   hx after child birth, no problems since  . GERD (gastroesophageal reflux disease)    diet controlled   . SVD (spontaneous vaginal delivery)    x 3  . UTI (urinary tract infection)     Past Surgical History:  Procedure Laterality Date  . TUBAL LIGATION      Family History  Problem Relation Age of Onset  . Alcohol abuse Mother   . Hypertension Mother   . Healthy Father   . Hypertension Maternal Grandmother   . Colon cancer Neg Hx   . Colon polyps Neg Hx   . Rectal cancer Neg Hx   . Stomach cancer Neg Hx     SOCIAL HX: never smoker   Current Outpatient Medications:  .  cetirizine (ZYRTEC) 10 MG tablet, Take 10 mg by mouth daily., Disp: , Rfl:  .  Multiple Vitamin (MULTIVITAMIN) tablet, Take 1 tablet by mouth daily., Disp: , Rfl:  .  albuterol (PROVENTIL HFA;VENTOLIN HFA) 108 (90 Base) MCG/ACT inhaler, Inhale 2 puffs into the lungs 4 (four)  times daily as needed., Disp: , Rfl:   Current Facility-Administered Medications:  .  0.9 %  sodium chloride infusion, 500 mL, Intravenous, Once, Danis, Kirke Corin, MD  EXAM:  VITALS per patient if applicable: BP Readings from Last 3 Encounters:  07/28/18 (!) 130/100  06/28/18 130/90  05/24/18 (!) 142/88     GENERAL: alert, oriented, appears well and in no acute distress  HEENT: atraumatic, conjunttiva clear, no obvious abnormalities on inspection of external nose and ears  NECK: normal movements of the head and neck  LUNGS: on inspection no signs of respiratory distress, breathing rate appears normal, no obvious gross SOB, gasping or wheezing  CV: no obvious cyanosis  MS: moves all visible extremities without noticeable abnormality  PSYCH/NEURO: pleasant and cooperative, no obvious depression or anxiety, speech and thought processing grossly intact  ASSESSMENT AND PLAN:  Discussed the following assessment and plan:  Encounter for screening mammogram for malignant neoplasm of breast - Plan: MM 3D SCREEN BREAST BILATERAL, CBC with Differential/Platelet, Comprehensive metabolic panel, Hemoglobin A1c, Lipid panel, TSH, VITAMIN D 25 Hydroxy (Vit-D Deficiency, Fractures)  Elevated blood pressure reading  Allergy, initial encounter  Encounter for medical examination to establish care Problem List Items Addressed This Visit      Other   Allergies    Controlled on current regimen      Elevated blood  pressure reading    Will send from readings from home and she will also come in for nurse visit and BP check. Likely will start amlopidpine 2.5mg  if greater than 120/80.       Encounter for medical examination to establish care    Reviewed past medical history with patient today.  She does follow with GYN for women's care      Screening for breast cancer - Primary    Over due. Patient will call GI- Breast cancer and I have given phone number.       Relevant Orders   MM  3D SCREEN BREAST BILATERAL   CBC with Differential/Platelet   Comprehensive metabolic panel   Hemoglobin A1c   Lipid panel   TSH   VITAMIN D 25 Hydroxy (Vit-D Deficiency, Fractures)      -we discussed possible serious and likely etiologies, options for evaluation and workup, limitations of telemedicine visit vs in person visit, treatment, treatment risks and precautions. Pt prefers to treat via telemedicine empirically rather then risking or undertaking an in person visit at this moment. Patient agrees to seek prompt in person care if worsening, new symptoms arise, or if is not improving with treatment.   I discussed the assessment and treatment plan with the patient. The patient was provided an opportunity to ask questions and all were answered. The patient agreed with the plan and demonstrated an understanding of the instructions.   The patient was advised to call back or seek an in-person evaluation if the symptoms worsen or if the condition fails to improve as anticipated.   Rennie Plowman, FNP

## 2019-09-12 NOTE — Assessment & Plan Note (Addendum)
Over due. Patient will call GI- Breast cancer and I have given phone number.

## 2019-09-12 NOTE — Assessment & Plan Note (Signed)
Reviewed past medical history with patient today.  She does follow with GYN for [facility]

## 2019-09-12 NOTE — Assessment & Plan Note (Addendum)
Will send from readings from home and she will also come in for nurse visit and BP check. Likely will start amlopidpine 2.5mg  if greater than 120/80.

## 2019-09-12 NOTE — Assessment & Plan Note (Signed)
Controlled on current regimen.   

## 2019-09-19 ENCOUNTER — Ambulatory Visit
Admission: RE | Admit: 2019-09-19 | Discharge: 2019-09-19 | Disposition: A | Discharge status: Home / Self Care | Payer: 59 | Source: Ambulatory Visit | Attending: Family | Admitting: Family

## 2019-09-19 ENCOUNTER — Other Ambulatory Visit: Payer: Self-pay

## 2019-09-19 ENCOUNTER — Ambulatory Visit: Payer: 59

## 2019-09-19 DIAGNOSIS — Z1231 Encounter for screening mammogram for malignant neoplasm of breast: Secondary | ICD-10-CM

## 2019-09-20 ENCOUNTER — Other Ambulatory Visit (INDEPENDENT_AMBULATORY_CARE_PROVIDER_SITE_OTHER): Payer: 59

## 2019-09-20 ENCOUNTER — Ambulatory Visit (INDEPENDENT_AMBULATORY_CARE_PROVIDER_SITE_OTHER): Payer: 59

## 2019-09-20 ENCOUNTER — Other Ambulatory Visit: Payer: Self-pay | Admitting: Family

## 2019-09-20 VITALS — BP 130/80 | HR 80

## 2019-09-20 DIAGNOSIS — Z1231 Encounter for screening mammogram for malignant neoplasm of breast: Secondary | ICD-10-CM | POA: Diagnosis not present

## 2019-09-20 DIAGNOSIS — Z23 Encounter for immunization: Secondary | ICD-10-CM

## 2019-09-20 DIAGNOSIS — R928 Other abnormal and inconclusive findings on diagnostic imaging of breast: Secondary | ICD-10-CM

## 2019-09-20 NOTE — Progress Notes (Signed)
Patient here for nurse visit BP check per order from Sena Hitch   Patient reports compliance with prescribed BP medications: no not on medication.   BP Readings from Last 3 Encounters:  09/20/19 130/80  07/28/18 (!) 130/100  06/28/18 130/90   Pulse Readings from Last 3 Encounters:  09/20/19 80  07/28/18 82  06/28/18 71    Per Dr. Mclean-Scoccuza  Stated patient should check her BP at home and if it is above 130/80 she should let us know.     Patient verbalized understanding of instructions.   Allean Found, CMA

## 2019-09-21 LAB — CBC WITH DIFFERENTIAL/PLATELET
Basophils Absolute: 0 10*3/uL (ref 0.0–0.1)
Basophils Relative: 0.7 % (ref 0.0–3.0)
Eosinophils Absolute: 0 10*3/uL (ref 0.0–0.7)
Eosinophils Relative: 0.4 % (ref 0.0–5.0)
HCT: 34.6 % — ABNORMAL LOW (ref 36.0–46.0)
Hemoglobin: 11.7 g/dL — ABNORMAL LOW (ref 12.0–15.0)
Lymphocytes Relative: 37.9 % (ref 12.0–46.0)
Lymphs Abs: 2 10*3/uL (ref 0.7–4.0)
MCHC: 33.8 g/dL (ref 30.0–36.0)
MCV: 87.1 fl (ref 78.0–100.0)
Monocytes Absolute: 0.2 10*3/uL (ref 0.1–1.0)
Monocytes Relative: 4.7 % (ref 3.0–12.0)
Neutro Abs: 2.9 10*3/uL (ref 1.4–7.7)
Neutrophils Relative %: 56.3 % (ref 43.0–77.0)
Platelets: 249 10*3/uL (ref 150.0–400.0)
RBC: 3.97 Mil/uL (ref 3.87–5.11)
RDW: 14.3 % (ref 11.5–15.5)
WBC: 5.2 10*3/uL (ref 4.0–10.5)

## 2019-09-21 LAB — VITAMIN D 25 HYDROXY (VIT D DEFICIENCY, FRACTURES): VITD: 33.06 ng/mL (ref 30.00–100.00)

## 2019-09-21 LAB — COMPREHENSIVE METABOLIC PANEL
ALT: 10 U/L (ref 0–35)
AST: 16 U/L (ref 0–37)
Albumin: 4.1 g/dL (ref 3.5–5.2)
Alkaline Phosphatase: 93 U/L (ref 39–117)
BUN: 13 mg/dL (ref 6–23)
CO2: 27 mEq/L (ref 19–32)
Calcium: 9.3 mg/dL (ref 8.4–10.5)
Chloride: 102 mEq/L (ref 96–112)
Creatinine, Ser: 1 mg/dL (ref 0.40–1.20)
GFR: 70.3 mL/min (ref >60.00)
Glucose, Bld: 81 mg/dL (ref 70–99)
Potassium: 3.5 mEq/L (ref 3.5–5.1)
Sodium: 137 mEq/L (ref 135–145)
Total Bilirubin: 0.5 mg/dL (ref 0.2–1.2)
Total Protein: 8 g/dL (ref 6.0–8.3)

## 2019-09-21 LAB — LIPID PANEL
Cholesterol: 215 mg/dL — ABNORMAL HIGH (ref 0–200)
HDL: 53.6 mg/dL (ref >39.00)
LDL Cholesterol: 145 mg/dL — ABNORMAL HIGH (ref 0–99)
NonHDL: 161.89
Total CHOL/HDL Ratio: 4
Triglycerides: 84 mg/dL (ref 0.0–149.0)
VLDL: 16.8 mg/dL (ref 0.0–40.0)

## 2019-09-21 LAB — TSH: TSH: 1.06 u[IU]/mL (ref 0.35–4.50)

## 2019-09-21 LAB — HEMOGLOBIN A1C: Hgb A1c MFr Bld: 4.6 % (ref 4.6–6.5)

## 2019-09-26 ENCOUNTER — Other Ambulatory Visit: Payer: Self-pay | Admitting: Family

## 2019-09-26 DIAGNOSIS — R899 Unspecified abnormal finding in specimens from other organs, systems and tissues: Secondary | ICD-10-CM

## 2019-09-27 ENCOUNTER — Other Ambulatory Visit: Payer: Self-pay

## 2019-09-27 ENCOUNTER — Ambulatory Visit: Payer: 59

## 2019-09-27 ENCOUNTER — Ambulatory Visit
Admission: RE | Admit: 2019-09-27 | Discharge: 2019-09-27 | Disposition: A | Discharge status: Home / Self Care | Payer: 59 | Source: Ambulatory Visit | Attending: Family | Admitting: Family

## 2019-09-27 DIAGNOSIS — R928 Other abnormal and inconclusive findings on diagnostic imaging of breast: Secondary | ICD-10-CM

## 2019-10-27 ENCOUNTER — Ambulatory Visit: Payer: 59 | Attending: Internal Medicine

## 2019-10-27 DIAGNOSIS — Z23 Encounter for immunization: Secondary | ICD-10-CM | POA: Insufficient documentation

## 2019-10-27 NOTE — Progress Notes (Signed)
   Covid-19 Vaccination Clinic  Name:  Denise Kramer    MRN: [id number] DOB: October 01, 1965  10/27/2019  Denise Kramer was observed post Covid-19 immunization for 15 minutes without incident. She was provided with Vaccine Information Sheet and instruction to access the V-Safe system.   Denise Kramer was instructed to call 911 with any severe reactions post vaccine: Marland Kitchen Difficulty breathing  . Swelling of face and throat  . A fast heartbeat  . A bad rash all over body  . Dizziness and weakness   Immunizations Administered    Name Date Dose VIS Date Route   Pfizer COVID-19 Vaccine 10/27/2019  2:18 PM 0.3 mL 08/03/2019 Intramuscular   Manufacturer: [employer], [employer]   Lot: [id number]   NDC: [address]-9

## 2019-11-27 ENCOUNTER — Ambulatory Visit: Payer: 59 | Attending: Internal Medicine

## 2019-11-27 DIAGNOSIS — Z23 Encounter for immunization: Secondary | ICD-10-CM

## 2019-11-27 NOTE — Progress Notes (Signed)
   Covid-19 Vaccination Clinic  Name:  Denise Kramer    MRN: [id number] DOB: 08/25/1965  11/27/2019  [name] was observed post Covid-19 immunization for 15 minutes without incident. She was provided with Vaccine Information Sheet and instruction to access the V-Safe system.   [name] was instructed to call 911 with any severe reactions post vaccine: Marland Kitchen Difficulty breathing  . Swelling of face and throat  . A fast heartbeat  . A bad rash all over body  . Dizziness and weakness   Immunizations Administered    Name Date Dose VIS Date Route   Pfizer COVID-19 Vaccine 11/27/2019  9:02 AM 0.3 mL 08/03/2019 Intramuscular   Manufacturer: [employer], [employer]   Lot: [id number]   NDC: [address]-8

## 2020-09-24 ENCOUNTER — Other Ambulatory Visit: Payer: Self-pay | Admitting: Family

## 2020-09-24 DIAGNOSIS — Z1231 Encounter for screening mammogram for malignant neoplasm of breast: Secondary | ICD-10-CM

## 2020-10-20 ENCOUNTER — Encounter: Payer: 59 | Admitting: Family

## 2020-11-10 ENCOUNTER — Inpatient Hospital Stay: Admission: RE | Admit: 2020-11-10 | Payer: 59 | Source: Ambulatory Visit

## 2020-11-17 ENCOUNTER — Encounter: Payer: 59 | Admitting: Family

## 2020-12-08 ENCOUNTER — Encounter: Payer: 59 | Admitting: Family

## 2020-12-24 ENCOUNTER — Encounter: Payer: 59 | Admitting: Family

## 2021-01-01 ENCOUNTER — Ambulatory Visit
Admission: RE | Admit: 2021-01-01 | Discharge: 2021-01-01 | Disposition: A | Discharge status: Home / Self Care | Payer: 59 | Source: Ambulatory Visit | Attending: Family | Admitting: Family

## 2021-01-01 ENCOUNTER — Other Ambulatory Visit: Payer: Self-pay

## 2021-01-01 DIAGNOSIS — Z1231 Encounter for screening mammogram for malignant neoplasm of breast: Secondary | ICD-10-CM

## 2021-01-27 ENCOUNTER — Encounter: Payer: Self-pay | Admitting: Family

## 2021-01-27 ENCOUNTER — Ambulatory Visit (INDEPENDENT_AMBULATORY_CARE_PROVIDER_SITE_OTHER): Payer: BC Managed Care – PPO | Admitting: Family

## 2021-01-27 ENCOUNTER — Other Ambulatory Visit: Payer: Self-pay

## 2021-01-27 DIAGNOSIS — Z0000 Encounter for general adult medical examination without abnormal findings: Secondary | ICD-10-CM | POA: Diagnosis not present

## 2021-01-27 DIAGNOSIS — M2550 Pain in unspecified joint: Secondary | ICD-10-CM | POA: Insufficient documentation

## 2021-01-27 NOTE — Patient Instructions (Signed)
Please let me know if wrist or back pain persists as I would recommend labs, xray and orthopedic consult  Nice to see you, always!   Health Maintenance, Female Adopting a healthy lifestyle and getting preventive care are important in promoting health and wellness. Ask your health care provider about:  The right schedule for you to have regular tests and exams.  Things you can do on your own to prevent diseases and keep yourself healthy. What should I know about diet, weight, and exercise? Eat a healthy diet  Eat a diet that includes plenty of vegetables, fruits, low-fat dairy products, and lean protein.  Do not eat a lot of foods that are high in solid fats, added sugars, or sodium.   Maintain a healthy weight Body mass index (BMI) is used to identify weight problems. It estimates body fat based on height and weight. Your health care provider can help determine your BMI and help you achieve or maintain a healthy weight. Get regular exercise Get regular exercise. This is one of the most important things you can do for your health. Most adults should:  Exercise for at least 150 minutes each week. The exercise should increase your heart rate and make you sweat (moderate-intensity exercise).  Do strengthening exercises at least twice a week. This is in addition to the moderate-intensity exercise.  Spend less time sitting. Even light physical activity can be beneficial. Watch cholesterol and blood lipids Have your blood tested for lipids and cholesterol at 54 years of age, then have this test every 5 years. Have your cholesterol levels checked more often if:  Your lipid or cholesterol levels are high.  You are older than 54 years of age.  You are at high risk for heart disease. What should I know about cancer screening? Depending on your health history and family history, you may need to have cancer screening at various ages. This may include screening for:  Breast  cancer.  Cervical cancer.  Colorectal cancer.  Skin cancer.  Lung cancer. What should I know about heart disease, diabetes, and high blood pressure? Blood pressure and heart disease  High blood pressure causes heart disease and increases the risk of stroke. This is more likely to develop in people who have high blood pressure readings, are of African descent, or are overweight.  Have your blood pressure checked: ? Every 3-5 years if you are 57-27 years of age. ? Every year if you are 66 years old or older. Diabetes Have regular diabetes screenings. This checks your fasting blood sugar level. Have the screening done:  Once every three years after age 16 if you are at a normal weight and have a low risk for diabetes.  More often and at a younger age if you are overweight or have a high risk for diabetes. What should I know about preventing infection? Hepatitis B If you have a higher risk for hepatitis B, you should be screened for this virus. Talk with your health care provider to find out if you are at risk for hepatitis B infection. Hepatitis C Testing is recommended for:  Everyone born from [birth year] through 1964.  Anyone with known risk factors for hepatitis C. Sexually transmitted infections (STIs)  Get screened for STIs, including gonorrhea and chlamydia, if: ? You are sexually active and are younger than 54 years of age. ? You are older than 54 years of age and your health care provider tells you that you are at risk for this type of  infection. ? Your sexual activity has changed since you were last screened, and you are at increased risk for chlamydia or gonorrhea. Ask your health care provider if you are at risk.  Ask your health care provider about whether you are at high risk for HIV. Your health care provider may recommend a prescription medicine to help prevent HIV infection. If you choose to take medicine to prevent HIV, you should first get tested for HIV. You should  then be tested every 3 months for as long as you are taking the medicine. Pregnancy  If you are about to stop having your period (premenopausal) and you may become pregnant, seek counseling before you get pregnant.  Take 400 to 800 micrograms (mcg) of folic acid every day if you become pregnant.  Ask for birth control (contraception) if you want to prevent pregnancy. Osteoporosis and menopause Osteoporosis is a disease in which the bones lose minerals and strength with aging. This can result in bone fractures. If you are 4 years old or older, or if you are at risk for osteoporosis and fractures, ask your health care provider if you should:  Be screened for bone loss.  Take a calcium or vitamin D supplement to lower your risk of fractures.  Be given hormone replacement therapy (HRT) to treat symptoms of menopause. Follow these instructions at home: Lifestyle  Do not use any products that contain nicotine or tobacco, such as cigarettes, e-cigarettes, and chewing tobacco. If you need help quitting, ask your health care provider.  Do not use street drugs.  Do not share needles.  Ask your health care provider for help if you need support or information about quitting drugs. Alcohol use  Do not drink alcohol if: ? Your health care provider tells you not to drink. ? You are pregnant, may be pregnant, or are planning to become pregnant.  If you drink alcohol: ? Limit how much you use to 0-1 drink a day. ? Limit intake if you are breastfeeding.  Be aware of how much alcohol is in your drink. In the [place] one drink equals one 12 oz bottle of beer (355 mL), one 5 oz glass of wine (148 mL), or one 1 oz glass of hard liquor (44 mL). General instructions  Schedule regular health, dental, and eye exams.  Stay current with your vaccines.  Tell your health care provider if: ? You often feel depressed. ? You have ever been abused or do not feel safe at home. Summary  Adopting a  healthy lifestyle and getting preventive care are important in promoting health and wellness.  Follow your health care provider's instructions about healthy diet, exercising, and getting tested or screened for diseases.  Follow your health care provider's instructions on monitoring your cholesterol and blood pressure. This information is not intended to replace advice given to you by your health care provider. Make sure you discuss any questions you have with your health care provider. Document Revised: 08/02/2018 Document Reviewed: 08/02/2018 Elsevier Patient Education  2021 [employer].

## 2021-01-27 NOTE — Assessment & Plan Note (Signed)
Left shoulder, wrist and thumb pain resolved. Unable to elicit on exam. Discussed labs to screen for gout, autoimmune disease ( RA) , baseline xrays. She declines further evaluation at this time and would like to start aleve again and give symptoms more time. She will let me know if recurs.

## 2021-01-27 NOTE — Assessment & Plan Note (Signed)
Deferred CBE and pelvic exam in the absence of complaints and she follows with GYN. Encouraged exercise.

## 2021-01-27 NOTE — Progress Notes (Signed)
Subjective:    Patient ID: Denise Kramer, female    DOB: 1965/08/28, 55 y.o.   MRN: [id number]  CC: Denise Kramer is a 55 y.o. female who presents today for physical exam.    HPI: Complains of left sided neck pain x 2 weeks, improved.   Describes 'sore ness' .  She had numbness coming from left side of neck down left arm. Wore a neoprene sleeve with relief. Relief with ibuprofen.   Distal numbness bilateral thumb, worse in left which has resolved as well.  Tried ice on thumb with relief.   No weakness in arms, hands.   No falls, known injury, joint swelling, redness, increased heat, rash, HA, vision changes, numbness of the face.   No h/o RA, gout.   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        Colorectal Cancer Screening: UTD , dr Denise Kramer; repeat 10 years  Breast Cancer Screening: Mammogram UTD Cervical Cancer Screening: UTD per patient and reported done with [facility] in [place]         Tetanus - UTD         Labs: declines screening labs today with Dr Denise Kramer.  Exercise: Gets regular exercise.   Alcohol use:  occassional Smoking/tobacco use: Nonsmoker.     HISTORY:  Past Medical History:  Diagnosis Date  . Allergy   . Anemia 1985   hx after child birth, no problems since  . GERD (gastroesophageal reflux disease)    diet controlled   . SVD (spontaneous vaginal delivery)    x 3  . UTI (urinary tract infection)     Past Surgical History:  Procedure Laterality Date  . TUBAL LIGATION     Family History  Problem Relation Age of Onset  . Alcohol abuse Mother   . Hypertension Mother   . Healthy Father   . Hypertension Maternal Grandmother   . Colon cancer Neg Hx   . Colon polyps Neg Hx   . Rectal cancer Neg Hx   . Stomach cancer Neg Hx       ALLERGIES: Aspirin  Current Outpatient Medications on File Prior to Visit  Medication Sig Dispense Refill  .  albuterol (PROVENTIL HFA;VENTOLIN HFA) 108 (90 Base) MCG/ACT inhaler Inhale 2 puffs into the lungs 4 (four) times daily as needed.    . cetirizine (ZYRTEC) 10 MG tablet Take 10 mg by mouth daily.    . Multiple Vitamin (MULTIVITAMIN) tablet Take 1 tablet by mouth daily.     Current Facility-Administered Medications on File Prior to Visit  Medication Dose Route Frequency Provider Last Rate Last Admin  . 0.9 %  sodium chloride infusion  500 mL Intravenous Once Sherrilyn Rist, MD        Social History   Tobacco Use  . Smoking status: Never Smoker  . Smokeless tobacco: Never Used  Vaping Use  . Vaping Use: Never used  Substance Use Topics  . Alcohol use: Yes    Comment: occasional wine  . Drug use: No    Review of Systems  Constitutional: Negative for chills, fever and unexpected weight change.  HENT: Negative for congestion.   Eyes: Negative for visual disturbance.  Respiratory: Negative for cough.   Cardiovascular: Negative for chest pain, palpitations and leg swelling.  Gastrointestinal: Negative for nausea and vomiting.  Musculoskeletal: Positive for arthralgias. Negative  for joint swelling and myalgias.  Skin: Negative for rash.  Neurological: Positive for numbness. Negative for headaches.  Hematological: Negative for adenopathy.  Psychiatric/Behavioral: Negative for confusion.      Objective:    BP 122/84 (BP Location: Left Arm, Patient Position: Sitting, Cuff Size: Large)   Pulse 78   Temp (!) 96.4 F (35.8 C) (Temporal)   Ht 5\' 4"  (1.626 m)   Wt 171 lb 3.2 oz (77.7 kg)   SpO2 99%   BMI 29.39 kg/m   BP Readings from Last 3 Encounters:  01/27/21 122/84  09/20/19 130/80  07/28/18 (!) 130/100   Wt Readings from Last 3 Encounters:  01/27/21 171 lb 3.2 oz (77.7 kg)  09/12/19 176 lb (79.8 kg)  07/28/18 171 lb (77.6 kg)    Physical Exam Vitals reviewed.  Constitutional:      Appearance: She is well-developed.  Eyes:     Conjunctiva/sclera: Conjunctivae  normal.  Neck:     Thyroid: No thyroid mass or thyromegaly.  Cardiovascular:     Rate and Rhythm: Normal rate and regular rhythm.     Pulses: Normal pulses.     Heart sounds: Normal heart sounds.  Pulmonary:     Effort: Pulmonary effort is normal.     Breath sounds: Normal breath sounds. No wheezing, rhonchi or rales.  Musculoskeletal:     Right wrist: Normal. No swelling or bony tenderness. Normal range of motion.     Left wrist: Normal. No swelling or bony tenderness. Normal range of motion.     Cervical back: Normal. No swelling, tenderness or bony tenderness. No pain with movement. Normal range of motion.     Comments: Grip strength normal. Palpable radial pulses. No edema, increased warmth over MCP joints.  sensation intact Negative tinel, phalens. No pain with resisted wrist dorsiflexion.   Lymphadenopathy:     Head:     Right side of head: No submental, submandibular, tonsillar, preauricular, posterior auricular or occipital adenopathy.     Left side of head: No submental, submandibular, tonsillar, preauricular, posterior auricular or occipital adenopathy.     Cervical: No cervical adenopathy.  Skin:    General: Skin is warm and dry.  Neurological:     Mental Status: She is alert.  Psychiatric:        Speech: Speech normal.        Behavior: Behavior normal.        Thought Content: Thought content normal.        Assessment & Plan:   Problem List Items Addressed This Visit      Other   Arthralgia    Left shoulder, wrist and thumb pain resolved. Unable to elicit on exam. Discussed labs to screen for gout, autoimmune disease ( RA) , baseline xrays. She declines further evaluation at this time and would like to start aleve again and give symptoms more time. She will let me know if recurs.       Routine general medical examination at a health care facility    Deferred CBE and pelvic exam in the absence of complaints and she follows with GYN. Encouraged exercise.            I am having 14/06/19 maintain her multivitamin, albuterol, and cetirizine. We will continue to administer sodium chloride.   No orders of the defined types were placed in this encounter.   Return precautions given.   Risks, benefits, and alternatives of the medications and treatment plan prescribed today were  discussed, and patient expressed understanding.   Education regarding symptom management and diagnosis given to patient on AVS.   Continue to follow with Allegra Grana, FNP for routine health maintenance.   Ladoris Gene and I agreed with plan.   Rennie Plowman, FNP

## 2021-01-28 ENCOUNTER — Encounter: Payer: 59 | Admitting: Family

## 2021-09-28 ENCOUNTER — Emergency Department
Admission: EM | Admit: 2021-09-28 | Discharge: 2021-09-28 | Disposition: A | Discharge status: Home / Self Care | Payer: BC Managed Care – PPO | Attending: Emergency Medicine | Admitting: Emergency Medicine

## 2021-09-28 ENCOUNTER — Other Ambulatory Visit: Payer: Self-pay

## 2021-09-28 ENCOUNTER — Emergency Department: Payer: BC Managed Care – PPO

## 2021-09-28 DIAGNOSIS — R42 Dizziness and giddiness: Secondary | ICD-10-CM | POA: Insufficient documentation

## 2021-09-28 DIAGNOSIS — R519 Headache, unspecified: Secondary | ICD-10-CM | POA: Diagnosis not present

## 2021-09-28 DIAGNOSIS — M79601 Pain in right arm: Secondary | ICD-10-CM | POA: Insufficient documentation

## 2021-09-28 DIAGNOSIS — H538 Other visual disturbances: Secondary | ICD-10-CM | POA: Insufficient documentation

## 2021-09-28 DIAGNOSIS — R1013 Epigastric pain: Secondary | ICD-10-CM | POA: Insufficient documentation

## 2021-09-28 LAB — TROPONIN I (HIGH SENSITIVITY): Troponin I (High Sensitivity): <2 ng/L (ref <18)

## 2021-09-28 LAB — DIFFERENTIAL
Abs Immature Granulocytes: 0.01 10*3/uL (ref 0.00–0.07)
Basophils Absolute: 0 10*3/uL (ref 0.0–0.1)
Basophils Relative: 0 %
Eosinophils Absolute: 0 10*3/uL (ref 0.0–0.5)
Eosinophils Relative: 0 %
Immature Granulocytes: 0 %
Lymphocytes Relative: 40 %
Lymphs Abs: 1.9 10*3/uL (ref 0.7–4.0)
Monocytes Absolute: 0.2 10*3/uL (ref 0.1–1.0)
Monocytes Relative: 5 %
Neutro Abs: 2.5 10*3/uL (ref 1.7–7.7)
Neutrophils Relative %: 55 %

## 2021-09-28 LAB — CBC
HCT: 36.3 % (ref 36.0–46.0)
Hemoglobin: 11.6 g/dL — ABNORMAL LOW (ref 12.0–15.0)
MCH: 28.9 pg (ref 26.0–34.0)
MCHC: 32 g/dL (ref 30.0–36.0)
MCV: 90.3 fL (ref 80.0–100.0)
Platelets: 218 10*3/uL (ref 150–400)
RBC: 4.02 MIL/uL (ref 3.87–5.11)
RDW: 13.1 % (ref 11.5–15.5)
WBC: 4.7 10*3/uL (ref 4.0–10.5)
nRBC: 0 % (ref 0.0–0.2)

## 2021-09-28 LAB — COMPREHENSIVE METABOLIC PANEL
ALT: 12 U/L (ref 0–44)
AST: 16 U/L (ref 15–41)
Albumin: 3.9 g/dL (ref 3.5–5.0)
Alkaline Phosphatase: 83 U/L (ref 38–126)
Anion gap: 7 (ref 5–15)
BUN: 12 mg/dL (ref 6–20)
CO2: 28 mmol/L (ref 22–32)
Calcium: 9 mg/dL (ref 8.9–10.3)
Chloride: 104 mmol/L (ref 98–111)
Creatinine, Ser: 1.08 mg/dL — ABNORMAL HIGH (ref 0.44–1.00)
GFR, Estimated: >60 mL/min (ref >60)
Glucose, Bld: 95 mg/dL (ref 70–99)
Potassium: 3.7 mmol/L (ref 3.5–5.1)
Sodium: 139 mmol/L (ref 135–145)
Total Bilirubin: 0.7 mg/dL (ref 0.3–1.2)
Total Protein: 7.8 g/dL (ref 6.5–8.1)

## 2021-09-28 LAB — LIPASE, BLOOD: Lipase: 28 U/L (ref 11–51)

## 2021-09-28 LAB — APTT: aPTT: 31 seconds (ref 24–36)

## 2021-09-28 LAB — PROTIME-INR
INR: 1.1 (ref 0.8–1.2)
Prothrombin Time: 14.1 seconds (ref 11.4–15.2)

## 2021-09-28 MED ORDER — ALUM & MAG HYDROXIDE-SIMETH 200-200-20 MG/5ML PO SUSP
30.0000 mL | Freq: Once | ORAL | Status: AC
  Administered 2021-09-28: 30 mL via ORAL
  Filled 2021-09-28: qty 30

## 2021-09-28 MED ORDER — PANTOPRAZOLE SODIUM 40 MG PO TBEC
40.0000 mg | DELAYED_RELEASE_TABLET | Freq: Every day | ORAL | 0 refills | Status: AC
Start: 2021-09-28 — End: 2021-10-28

## 2021-09-28 MED ORDER — LIDOCAINE VISCOUS HCL 2 % MT SOLN
15.0000 mL | Freq: Once | OROMUCOSAL | Status: AC
  Administered 2021-09-28: 15 mL via ORAL
  Filled 2021-09-28: qty 15

## 2021-09-28 NOTE — ED Triage Notes (Addendum)
Patient to ER via POV with complaints of blurred vision, headache, and left arm pain that started this morning when she woke up. Describes vision as "foggy in the center". Reports getting ready for work, went to [employer] to check her BP and had a reading of 220/88. No hx of high blood pressure.   Patient also reports epigastric pain x2 weeks, describes as heavy and burning. Also has this sensation in her throat. Increased burping/ gas. Reports hx of acid reflux usually relieved by zantac but now states there has been no improvement of symptoms with medication usage.

## 2021-09-28 NOTE — ED Notes (Addendum)
No changes, alert, NAD, calm, interactive. Up to b/r, steady gait.

## 2021-09-28 NOTE — ED Provider Notes (Signed)
[facility] Provider Note    Event Date/Time   First MD Initiated Contact with Patient 09/28/21 1147     (approximate)   History   Dizziness   HPI  Denise Kramer is a 56 y.o. female with past medical history of GERD and anemia who presents with epigastric abdominal pain.  Patient notes that over the last 2 weeks she has had a fairly constant burning feeling in the center of her chest radiating up to her throat.  Also with frequent belching which sometimes improves the symptoms.  They are nonexertional nonpleuritic and not associate with nausea vomiting or melena.  Patient has been taking Zantac.  Has had similar before but usually the Zantac relieves it.  Today when she woke up she had a mild right-sided headache and her left arm had a ache.  She went to [facility] prior to going to work and checked her blood pressure and it was elevated which concerned her given her constellation of symptoms so she came to the emergency department.  She felt some blurred vision at the time which is now resolved.  No ongoing headache or arm pain.  Denies numbness tingling or weakness.  Denies chest pain.  No fevers or chills.    Past Medical History:  Diagnosis Date   Allergy    Anemia 1985   hx after child birth, no problems since   GERD (gastroesophageal reflux disease)    diet controlled    SVD (spontaneous vaginal delivery)    x 3   UTI (urinary tract infection)     Patient Active Problem List   Diagnosis Date Noted   Arthralgia 01/27/2021   Screening for breast cancer 09/12/2019   Elevated blood pressure reading 09/12/2019   Allergies 09/12/2019   Encounter for medical examination to establish care 09/12/2019   Hyperlipidemia 05/24/2018   Anemia 07/22/2017   Routine general medical examination at a health care facility 05/11/2016     Physical Exam  Triage Vital Signs: ED Triage Vitals  Enc Vitals Group     BP 09/28/21 1130 (!) 127/100     Pulse Rate  09/28/21 1130 77     Resp 09/28/21 1130 18     Temp 09/28/21 1130 98.4 F (36.9 C)     Temp Source 09/28/21 1130 Oral     SpO2 09/28/21 1130 98 %     Weight --      Height 09/28/21 1132 5\' 4"  (1.626 m)     Head Circumference --      Peak Flow --      Pain Score 09/28/21 1131 7     Pain Loc --      Pain Edu? --      Excl. in GC? --     Most recent vital signs: Vitals:   09/28/21 1315 09/28/21 1329  BP: (!) 150/100   Pulse: (!) 58 62  Resp:    Temp:    SpO2: 100% 100%     General: Awake, no distress.  CV:  Good peripheral perfusion.  Resp:  Normal effort.  Abd:  No distention.  Mild tenderness in the epigastric region Neuro:             Awake, Alert, Oriented x 3  Other:  Aox3, nml speech  PERRL, EOMI, face symmetric, nml tongue movement  5/5 strength in the BL upper and lower extremities  Sensation grossly intact in the BL upper and lower extremities  Finger-nose-finger intact BL  ED Results / Procedures / Treatments  Labs (all labs ordered are listed, but only abnormal results are displayed) Labs Reviewed  CBC - Abnormal; Notable for the following components:      Result Value   Hemoglobin 11.6 (*)    All other components within normal limits  COMPREHENSIVE METABOLIC PANEL - Abnormal; Notable for the following components:   Creatinine, Ser 1.08 (*)    All other components within normal limits  PROTIME-INR  APTT  DIFFERENTIAL  LIPASE, BLOOD  TROPONIN I (HIGH SENSITIVITY)     EKG  EKG interpretation performed by myself: NSR, nml axis, nml intervals, no acute ischemic changes    RADIOLOGY    PROCEDURES:  Critical Care performed: No  .1-3 Lead EKG Interpretation Performed by: Georga Hacking, MD Authorized by: Georga Hacking, MD    The patient is on the cardiac monitor to evaluate for evidence of arrhythmia and/or significant heart rate changes.   MEDICATIONS ORDERED IN ED: Medications  alum & mag hydroxide-simeth  (MAALOX/MYLANTA) 200-200-20 MG/5ML suspension 30 mL (30 mLs Oral Given 09/28/21 1307)    And  lidocaine (XYLOCAINE) 2 % viscous mouth solution 15 mL (15 mLs Oral Given 09/28/21 1307)     IMPRESSION / MDM / ASSESSMENT AND PLAN / ED COURSE  I reviewed the triage vital signs and the nursing notes.                              Differential diagnosis includes, but is not limited to, acid reflux, peptic ulcer, pancreatitis, ACS  Patient is a 56 year old female who presents primarily for 2 weeks of epigastric pain.  She is not having chest pain and her symptoms are constant and nonexertional and without associated nausea or diaphoresis.  Patient has been taking an H2 blocker which is really not helping has helped in the past when she had the symptoms.  She was concerned today because after she had a mild headache with some blurry vision she took her blood pressure and it was elevated.  At this time she had some aching in the left arm but again no chest pain.  The symptoms have now resolved.  She was hypertensive at the time the blood pressure in the 200s.  Blood pressure here 150 systolic with otherwise normal vital signs.  Patient appears well.  Her neurologic exam is completely normal.  Her abdomen is mildly tender in the epigastric region.  I reviewed her labs which are reassuring, negative lipase and LFTs and a negative troponin.  Her EKG is nonischemic.  Ultimately I suspect that patient is having ulcer/esophagitis/GERD pain and I am not concerned for ACS.  Given the lightheaded/blurred vision/headache is now resolved do not feel that she requires any imaging especially since her neurologic exam is normal.  I will switch her to a PPI and give her follow-up with gastroenterology.  Clinical Course as of 09/28/21 1538  Mon Sep 28, 2021  1259 Troponin I (High Sensitivity): <2 [KM]    Clinical Course User Index [KM] Georga Hacking, MD     FINAL CLINICAL IMPRESSION(S) / ED DIAGNOSES   Final  diagnoses:  Epigastric pain     Rx / DC Orders   ED Discharge Orders          Ordered    pantoprazole (PROTONIX) 40 MG tablet  Daily        09/28/21 1323  Note:  This document was prepared using Dragon voice recognition software and may include unintentional dictation errors.   Georga Hacking, MD 09/28/21 1538

## 2021-10-20 DIAGNOSIS — Z01419 Encounter for gynecological examination (general) (routine) without abnormal findings: Secondary | ICD-10-CM | POA: Diagnosis not present

## 2021-10-20 DIAGNOSIS — Z6829 Body mass index (BMI) 29.0-29.9, adult: Secondary | ICD-10-CM | POA: Diagnosis not present

## 2021-10-20 DIAGNOSIS — Z124 Encounter for screening for malignant neoplasm of cervix: Secondary | ICD-10-CM | POA: Diagnosis not present

## 2021-10-20 DIAGNOSIS — Z1151 Encounter for screening for human papillomavirus (HPV): Secondary | ICD-10-CM | POA: Diagnosis not present

## 2021-11-18 ENCOUNTER — Ambulatory Visit (INDEPENDENT_AMBULATORY_CARE_PROVIDER_SITE_OTHER): Payer: BC Managed Care – PPO | Admitting: Gastroenterology

## 2021-11-18 ENCOUNTER — Other Ambulatory Visit: Payer: Self-pay

## 2021-11-18 VITALS — BP 135/87 | HR 80 | Temp 98.3°F | Ht 65.0 in | Wt 170.0 lb

## 2021-11-18 DIAGNOSIS — R1013 Epigastric pain: Secondary | ICD-10-CM

## 2021-11-18 DIAGNOSIS — G8929 Other chronic pain: Secondary | ICD-10-CM

## 2021-11-18 NOTE — Progress Notes (Signed)
? ? ?Gastroenterology Consultation ? ?Referring Provider:     Allegra Grana, FNP ?Primary Care Physician:  Allegra Grana, FNP ?Primary Gastroenterologist:  Dr. Servando Snare     ?Reason for Consultation:     Epigastric pain ?      ? HPI:   ?Denise Kramer is a 56 y.o. y/o female referred for consultation & management of epigastric pain by Dr. Jason Coop, Lyn Records, FNP.  This patient comes to see me today after being seen by Dr. Myrtie Neither at [facility] gastroenterology with a colonoscopy in 2019.  The patient was in the emergency department back at the beginning of February with 2 weeks of fairly constant burning in the center of her chest radiating up to her throat.  The patient also has a history of GERD and anemia.  She reported that her symptoms frequently got better with Zantac but she took Zantac this time and it did not help.  She also had increased belching which she stated was improving her symptoms.  The patient was not found to have any worrisome findings on her blood work and was put on a PPI and told to follow-up with me. ?The patient reports that her symptoms have gone away except with certain foods including tomato sauce and tomato paste.  The patient also reports that she has been having some dark urine and after doing some research she thought it may be due to dehydration therefore she has been drinking Gatorade and now has started Pedialyte. ?There is no report of any further chest pains or epigastric pains. ? ?Past Medical History:  ?Diagnosis Date  ? Allergy   ? Anemia 1985  ? hx after child birth, no problems since  ? GERD (gastroesophageal reflux disease)   ? diet controlled   ? SVD (spontaneous vaginal delivery)   ? x 3  ? UTI (urinary tract infection)   ? ? ?Past Surgical History:  ?Procedure Laterality Date  ? TUBAL LIGATION    ? ? ?Prior to Admission medications   ?Medication Sig Start Date End Date Taking? Authorizing Provider  ?albuterol (PROVENTIL HFA;VENTOLIN HFA) 108 (90 Base) MCG/ACT inhaler  Inhale 2 puffs into the lungs 4 (four) times daily as needed. 09/23/17   [provider]  ?cetirizine (ZYRTEC) 10 MG tablet Take 10 mg by mouth daily.    [provider]  ?Multiple Vitamin (MULTIVITAMIN) tablet Take 1 tablet by mouth daily.    [provider]  ?pantoprazole (PROTONIX) 40 MG tablet Take 1 tablet (40 mg total) by mouth daily. 09/28/21 10/28/21  Georga Hacking, MD  ? ? ?Family History  ?Problem Relation Age of Onset  ? Alcohol abuse Mother   ? Hypertension Mother   ? Healthy Father   ? Hypertension Maternal Grandmother   ? Colon cancer Neg Hx   ? Colon polyps Neg Hx   ? Rectal cancer Neg Hx   ? Stomach cancer Neg Hx   ?  ? ?Social History  ? ?Tobacco Use  ? Smoking status: Never  ? Smokeless tobacco: Never  ?Vaping Use  ? Vaping Use: Never used  ?Substance Use Topics  ? Alcohol use: Yes  ?  Comment: occasional wine  ? Drug use: No  ? ? ?Allergies as of 11/18/2021 - Review Complete 09/28/2021  ?Allergen Reaction Noted  ? Aspirin Rash 05/11/2016  ? ? ?Review of Systems:    ?All systems reviewed and negative except where noted in HPI. ? ? Physical Exam:  ?There were no  vitals taken for this visit. ?No LMP recorded. Patient is postmenopausal. ?General:   Alert,  Well-developed, well-nourished, pleasant and cooperative in NAD ?Head:  Normocephalic and atraumatic. ?Eyes:  Sclera clear, no icterus.   Conjunctiva pink. ?Ears:  Normal auditory acuity. ?Neck:  Supple; no masses or thyromegaly. ?Lungs:  Respirations even and unlabored.  Clear throughout to auscultation.   No wheezes, crackles, or rhonchi. No acute distress. ?Heart:  Regular rate and rhythm; no murmurs, clicks, rubs, or gallops. ?Abdomen:  Normal bowel sounds.  No bruits.  Soft, non-tender and non-distended without masses, hepatosplenomegaly or hernias noted.  No guarding or rebound tenderness.  Negative Carnett sign.   ?Rectal:  Deferred.  ?Pulses:  Normal pulses noted. ?Extremities:  No clubbing or edema.  No  cyanosis. ?Neurologic:  Alert and oriented x3;  grossly normal neurologically. ?Skin:  Intact without significant lesions or rashes.  No jaundice. ?Lymph Nodes:  No significant cervical adenopathy. ?Psych:  Alert and cooperative. Normal mood and affect. ? ?Imaging Studies: ?No results found. ? ?Assessment and Plan:  ? ?Denise Kramer is a 56 y.o. y/o female who comes in today after being in the ER with epigastric pain that went into her chest and she states that the PPI resolves all of her symptoms.  The patient stopped the PPI 22 days ago and is not having any further symptoms except she will contact her primary care provider because of dark urine.  The patient has been encouraged to get onto MyChart and contact me if her symptoms return and she will be started back on her PPI.  The patient has been explained the plan agrees with it. ? ? ? ?Midge Minium, MD. Clementeen Graham ? ? ? Note: This dictation was prepared with Dragon dictation along with smaller phrase technology. Any transcriptional errors that result from this process are unintentional.   ?

## 2021-11-21 DIAGNOSIS — R399 Unspecified symptoms and signs involving the genitourinary system: Secondary | ICD-10-CM | POA: Diagnosis not present

## 2021-11-21 DIAGNOSIS — R8290 Unspecified abnormal findings in urine: Secondary | ICD-10-CM | POA: Diagnosis not present

## 2021-11-21 DIAGNOSIS — N390 Urinary tract infection, site not specified: Secondary | ICD-10-CM | POA: Diagnosis not present

## 2021-12-01 ENCOUNTER — Other Ambulatory Visit: Payer: Self-pay | Admitting: Family

## 2021-12-01 ENCOUNTER — Other Ambulatory Visit: Payer: Self-pay | Admitting: Obstetrics & Gynecology

## 2021-12-01 DIAGNOSIS — Z1231 Encounter for screening mammogram for malignant neoplasm of breast: Secondary | ICD-10-CM

## 2022-01-04 ENCOUNTER — Ambulatory Visit
Admission: RE | Admit: 2022-01-04 | Discharge: 2022-01-04 | Disposition: A | Discharge status: Home / Self Care | Payer: BC Managed Care – PPO | Source: Ambulatory Visit | Attending: Obstetrics & Gynecology | Admitting: Obstetrics & Gynecology

## 2022-01-04 DIAGNOSIS — Z1231 Encounter for screening mammogram for malignant neoplasm of breast: Secondary | ICD-10-CM

## 2022-02-01 ENCOUNTER — Encounter: Payer: BC Managed Care – PPO | Admitting: Family

## 2022-02-04 ENCOUNTER — Encounter: Payer: BC Managed Care – PPO | Admitting: Family

## 2022-02-04 ENCOUNTER — Ambulatory Visit (INDEPENDENT_AMBULATORY_CARE_PROVIDER_SITE_OTHER): Payer: BC Managed Care – PPO | Admitting: Family

## 2022-02-04 ENCOUNTER — Encounter: Payer: Self-pay | Admitting: Family

## 2022-02-04 VITALS — BP 122/80 | HR 87 | Temp 98.0°F | Ht 64.0 in | Wt 174.2 lb

## 2022-02-04 DIAGNOSIS — Z124 Encounter for screening for malignant neoplasm of cervix: Secondary | ICD-10-CM | POA: Diagnosis not present

## 2022-02-04 DIAGNOSIS — E785 Hyperlipidemia, unspecified: Secondary | ICD-10-CM | POA: Diagnosis not present

## 2022-02-04 DIAGNOSIS — R5383 Other fatigue: Secondary | ICD-10-CM | POA: Diagnosis not present

## 2022-02-04 DIAGNOSIS — Z0000 Encounter for general adult medical examination without abnormal findings: Secondary | ICD-10-CM | POA: Diagnosis not present

## 2022-02-04 DIAGNOSIS — R030 Elevated blood-pressure reading, without diagnosis of hypertension: Secondary | ICD-10-CM

## 2022-02-04 LAB — LIPID PANEL
Cholesterol: 229 mg/dL — ABNORMAL HIGH (ref 0–200)
HDL: 57.1 mg/dL (ref >39.00)
LDL Cholesterol: 154 mg/dL — ABNORMAL HIGH (ref 0–99)
NonHDL: 171.91
Total CHOL/HDL Ratio: 4
Triglycerides: 91 mg/dL (ref 0.0–149.0)
VLDL: 18.2 mg/dL (ref 0.0–40.0)

## 2022-02-04 LAB — CBC WITH DIFFERENTIAL/PLATELET
Basophils Absolute: 0 10*3/uL (ref 0.0–0.1)
Basophils Relative: 0.3 % (ref 0.0–3.0)
Eosinophils Absolute: 0 10*3/uL (ref 0.0–0.7)
Eosinophils Relative: 0.6 % (ref 0.0–5.0)
HCT: 34.6 % — ABNORMAL LOW (ref 36.0–46.0)
Hemoglobin: 11.4 g/dL — ABNORMAL LOW (ref 12.0–15.0)
Lymphocytes Relative: 35.5 % (ref 12.0–46.0)
Lymphs Abs: 1.4 10*3/uL (ref 0.7–4.0)
MCHC: 33.1 g/dL (ref 30.0–36.0)
MCV: 88.8 fl (ref 78.0–100.0)
Monocytes Absolute: 0.2 10*3/uL (ref 0.1–1.0)
Monocytes Relative: 5.8 % (ref 3.0–12.0)
Neutro Abs: 2.2 10*3/uL (ref 1.4–7.7)
Neutrophils Relative %: 57.8 % (ref 43.0–77.0)
Platelets: 213 10*3/uL (ref 150.0–400.0)
RBC: 3.9 Mil/uL (ref 3.87–5.11)
RDW: 14.4 % (ref 11.5–15.5)
WBC: 3.8 10*3/uL — ABNORMAL LOW (ref 4.0–10.5)

## 2022-02-04 LAB — COMPREHENSIVE METABOLIC PANEL
ALT: 9 U/L (ref 0–35)
AST: 15 U/L (ref 0–37)
Albumin: 3.9 g/dL (ref 3.5–5.2)
Alkaline Phosphatase: 90 U/L (ref 39–117)
BUN: 11 mg/dL (ref 6–23)
CO2: 29 mEq/L (ref 19–32)
Calcium: 8.9 mg/dL (ref 8.4–10.5)
Chloride: 104 mEq/L (ref 96–112)
Creatinine, Ser: 1.01 mg/dL (ref 0.40–1.20)
GFR: 62.93 mL/min (ref >60.00)
Glucose, Bld: 80 mg/dL (ref 70–99)
Potassium: 3.9 mEq/L (ref 3.5–5.1)
Sodium: 139 mEq/L (ref 135–145)
Total Bilirubin: 0.6 mg/dL (ref 0.2–1.2)
Total Protein: 7 g/dL (ref 6.0–8.3)

## 2022-02-04 LAB — TSH: TSH: 1.51 u[IU]/mL (ref 0.35–5.50)

## 2022-02-04 LAB — HEMOGLOBIN A1C: Hgb A1c MFr Bld: 4.6 % (ref 4.6–6.5)

## 2022-02-04 LAB — VITAMIN D 25 HYDROXY (VIT D DEFICIENCY, FRACTURES): VITD: 35.43 ng/mL (ref 30.00–100.00)

## 2022-02-04 NOTE — Progress Notes (Signed)
Subjective:    Patient ID: Denise Kramer, female    DOB: [birth year], 56 y.o.   MRN: [id number]  CC: Denise Kramer is a 56 y.o. female who presents today for physical exam.    HPI: Feels well today.  No new complaints.  Reports previous stress at work as well as at home.  She is feeling better about everything now.  Denies depression    Colorectal Cancer Screening: UTD , Denise Kramer 2019. Repeat in 10 years Breast Cancer Screening: Mammogram UTD Cervical Cancer Screening: UTD per patient. She was seen by Denise 09/2021 ( no records) , Denise Kramer Bone Health screening/DEXA for 65+: No increased fracture risk. Defer screening at this time.        Tetanus - UTD       Labs: Screening labs today. Exercise: Gets regular exercise walking.   Alcohol use:  occasional wine Smoking/tobacco use: Nonsmoker.     HISTORY:  Past Medical History:  Diagnosis Date   Allergy    Anemia 1985   hx after child birth, no problems since   GERD (gastroesophageal reflux disease)    diet controlled    SVD (spontaneous vaginal delivery)    x 3   UTI (urinary tract infection)     Past Surgical History:  Procedure Laterality Date   TUBAL LIGATION     Family History  Problem Relation Age of Onset   Alcohol abuse Mother    Hypertension Mother    Healthy Father    Hypertension Maternal Grandmother    Colon cancer Neg Hx    Colon polyps Neg Hx    Rectal cancer Neg Hx    Stomach cancer Neg Hx       ALLERGIES: Aspirin  Current Outpatient Medications on File Prior to Visit  Medication Sig Dispense Refill   albuterol (PROVENTIL HFA;VENTOLIN HFA) 108 (90 Base) MCG/ACT inhaler Inhale 2 puffs into the lungs 4 (four) times daily as needed.     cetirizine (ZYRTEC) 10 MG tablet Take 10 mg by mouth daily.     Multiple Vitamin (MULTIVITAMIN) tablet Take 1 tablet by mouth daily.     pantoprazole (PROTONIX) 40 MG tablet Take 1 tablet (40 mg total) by mouth daily. (Patient not taking: Reported on  11/18/2021) 30 tablet 0   Current Facility-Administered Medications on File Prior to Visit  Medication Dose Route Frequency Provider Last Rate Last Admin   0.9 %  sodium chloride infusion  500 mL Intravenous Once Sherrilyn Rist, MD        Social History   Tobacco Use   Smoking status: Never   Smokeless tobacco: Never  Vaping Use   Vaping Use: Never used  Substance Use Topics   Alcohol use: Yes    Comment: occasional wine   Drug use: No    Review of Systems  Constitutional:  Negative for chills and fever.  Respiratory:  Negative for cough.   Cardiovascular:  Negative for chest pain and palpitations.  Gastrointestinal:  Negative for nausea and vomiting.      Objective:    BP 122/80 (BP Location: Left Arm, Patient Position: Sitting, Cuff Size: Large)   Pulse 87   Temp 98 F (36.7 C) (Oral)   Ht 5\' 4"  (1.626 m)   Wt 174 lb 3.2 oz (79 kg)   SpO2 99%   BMI 29.90 kg/m   BP Readings from Last 3 Encounters:  02/04/22 122/80  11/18/21 135/87  09/28/21 (!) 150/100  Wt Readings from Last 3 Encounters:  02/04/22 174 lb 3.2 oz (79 kg)  11/18/21 170 lb (77.1 kg)  01/27/21 171 lb 3.2 oz (77.7 kg)    Physical Exam Vitals reviewed.  Constitutional:      Appearance: She is well-developed.  Eyes:     Conjunctiva/sclera: Conjunctivae normal.  Neck:     Thyroid: No thyroid mass or thyromegaly.  Cardiovascular:     Rate and Rhythm: Normal rate and regular rhythm.     Pulses: Normal pulses.     Heart sounds: Normal heart sounds.  Pulmonary:     Effort: Pulmonary effort is normal.     Breath sounds: Normal breath sounds. No wheezing, rhonchi or rales.  Lymphadenopathy:     Head:     Right side of head: No submental, submandibular, tonsillar, preauricular, posterior auricular or occipital adenopathy.     Left side of head: No submental, submandibular, tonsillar, preauricular, posterior auricular or occipital adenopathy.     Cervical: No cervical adenopathy.  Skin:     General: Skin is warm and dry.  Neurological:     Mental Status: She is alert.  Psychiatric:        Speech: Speech normal.        Behavior: Behavior normal.        Thought Content: Thought content normal.        Assessment & Plan:   Problem List Items Addressed This Visit       Other   Elevated blood pressure reading   Relevant Orders   CBC with Differential/Platelet   Hemoglobin A1c   Hyperlipidemia   Relevant Orders   Lipid panel   Routine general medical examination at a health care facility - Primary    Patient follows with Denise and politely declines breast or pelvic exam today.  Pap smear is up-to-date reported normal by patient with Denise Kramer.  Mammogram is up-to-date.  Encouraged self breast exam monthly at home.  Encouraged walking program.  Advised her to let me know most certainly if she would have increased stressors at home or concern for depression.  She stated she would let me know. she politely declines Shingrix vaccine today      Relevant Orders   Comprehensive metabolic panel   VITAMIN D 25 Hydroxy (Vit-D Deficiency, Fractures)   Other Visit Diagnoses     Screening for cervical cancer       Fatigue, unspecified type       Relevant Orders   TSH        I am having Denise Kramer maintain her multivitamin, albuterol, cetirizine, and pantoprazole. We will continue to administer sodium chloride.   No orders of the defined types were placed in this encounter.   Return precautions given.   Risks, benefits, and alternatives of the medications and treatment plan prescribed today were discussed, and patient expressed understanding.   Education regarding symptom management and diagnosis given to patient on AVS.   Continue to follow with Denise Grana, FNP for routine health maintenance.   Denise Kramer and I agreed with plan.   Denise Plowman, FNP

## 2022-02-04 NOTE — Assessment & Plan Note (Addendum)
Patient follows with GYN and politely declines breast or pelvic exam today.  Pap smear is up-to-date reported normal by patient with Dr Langston Masker.  Mammogram is up-to-date.  Encouraged self breast exam monthly at home.  Encouraged walking program.  Advised her to let me know most certainly if she would have increased stressors at home or concern for depression.  She stated she would let me know. she politely declines Shingrix vaccine today

## 2022-02-04 NOTE — Patient Instructions (Signed)
We recommend you get the updated bivalent COVID-19 booster, at least 2 months after any prior doses. You may consider delaying a booster dose by 3 months from a prior episode of COVID-19 per the CDC.  ? ?You can find pharmacies that have this formulation in stock at https://www.vaccines.gov/search  ? ?

## 2022-02-05 ENCOUNTER — Telehealth: Payer: Self-pay

## 2022-02-05 NOTE — Telephone Encounter (Signed)
LMTCB to office to go over results and inquire about starting Cholesterol medication.

## 2022-05-25 IMAGING — MG MM DIGITAL SCREENING BILAT W/ TOMO AND CAD
8 series · 8 of 24 positions shown · non-contrast
Comparison: Previous exam(s).

CLINICAL DATA: Screening.

EXAM:
DIGITAL SCREENING BILATERAL MAMMOGRAM WITH TOMOSYNTHESIS AND CAD
TECHNIQUE: Bilateral screening digital craniocaudal and mediolateral oblique
mammograms were obtained. Bilateral screening digital breast
tomosynthesis was performed. The images were evaluated with
computer-aided detection.

[R MLO synth-2D]
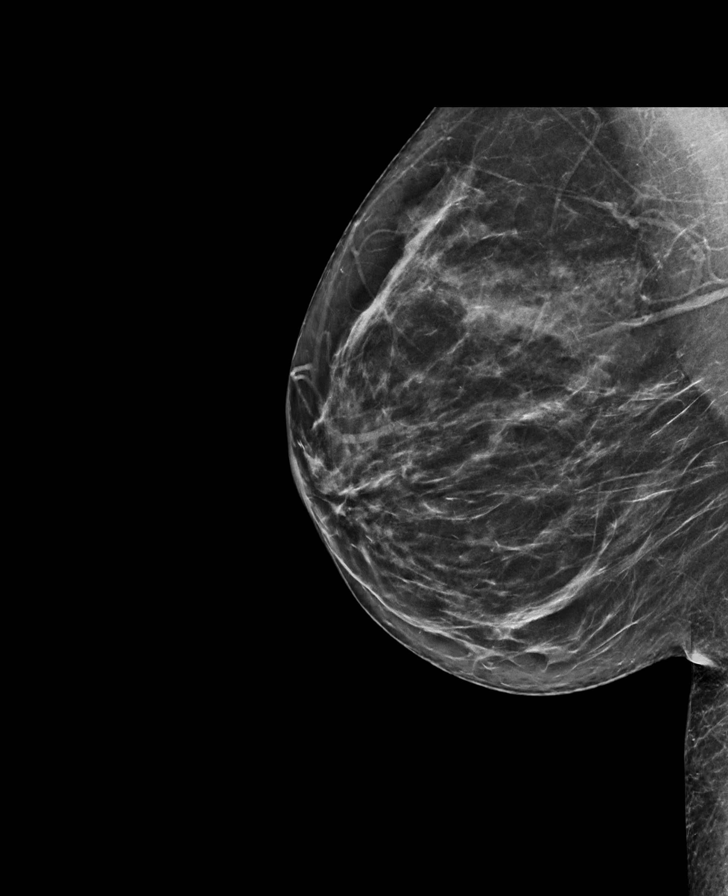

[L MLO synth-2D]
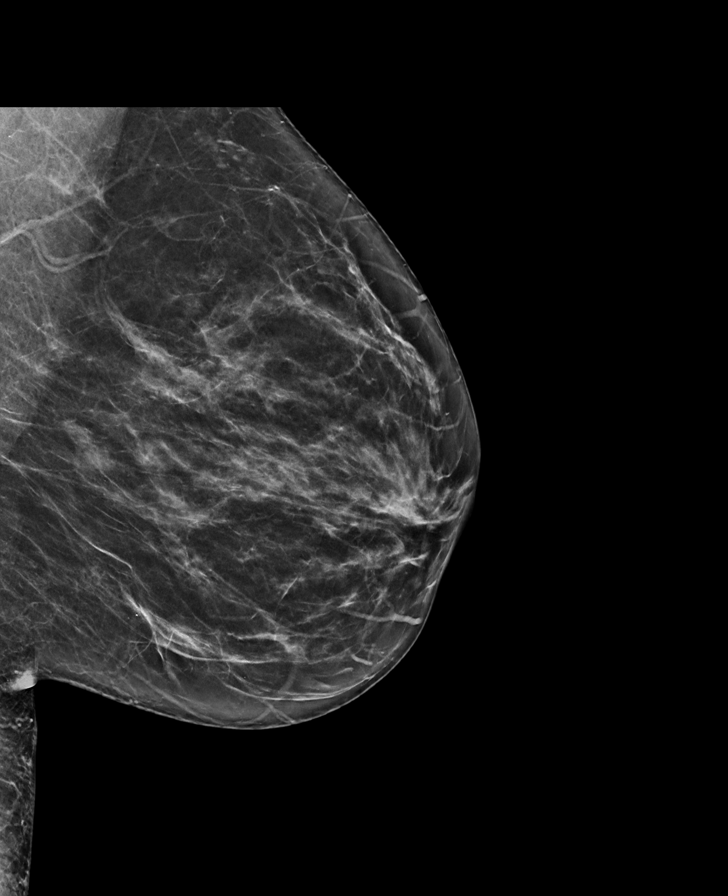

[R CC synth-2D]
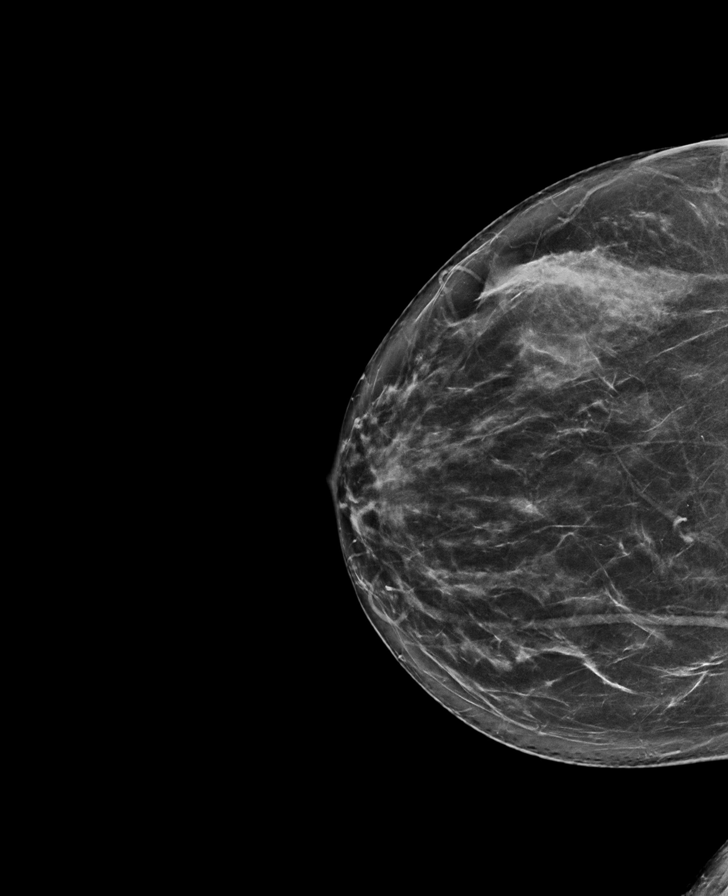

[L CC synth-2D]
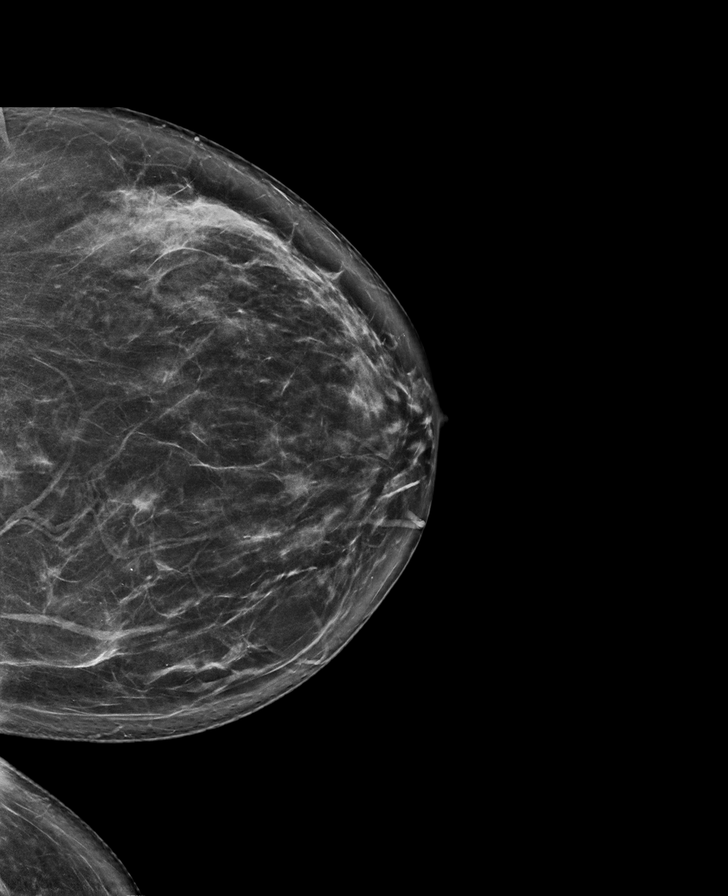

[L CC tomo · tomo slice 37/74.0]
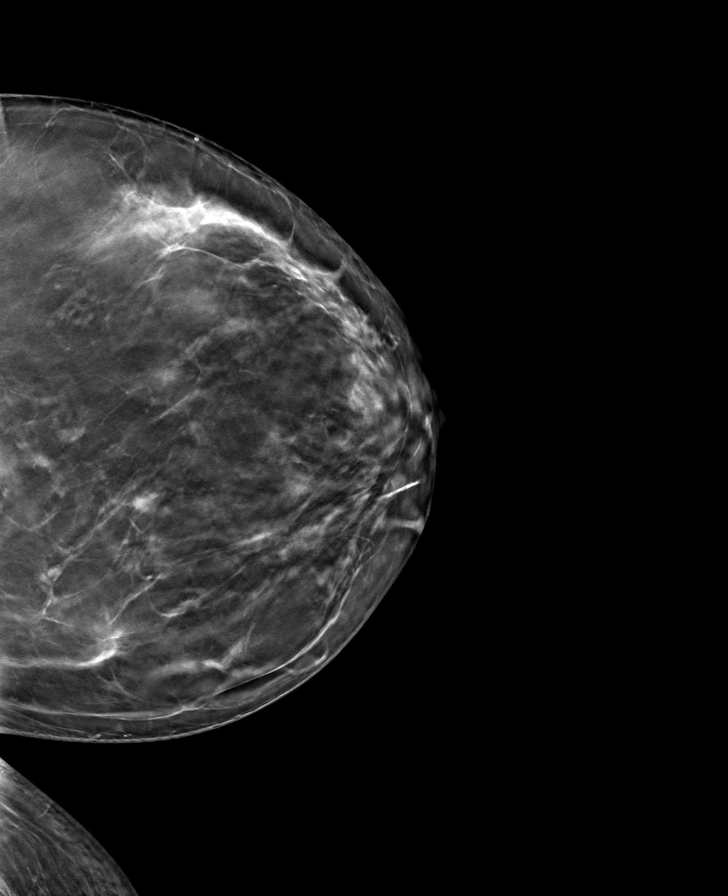

[R MLO tomo · tomo slice 37/72.0]
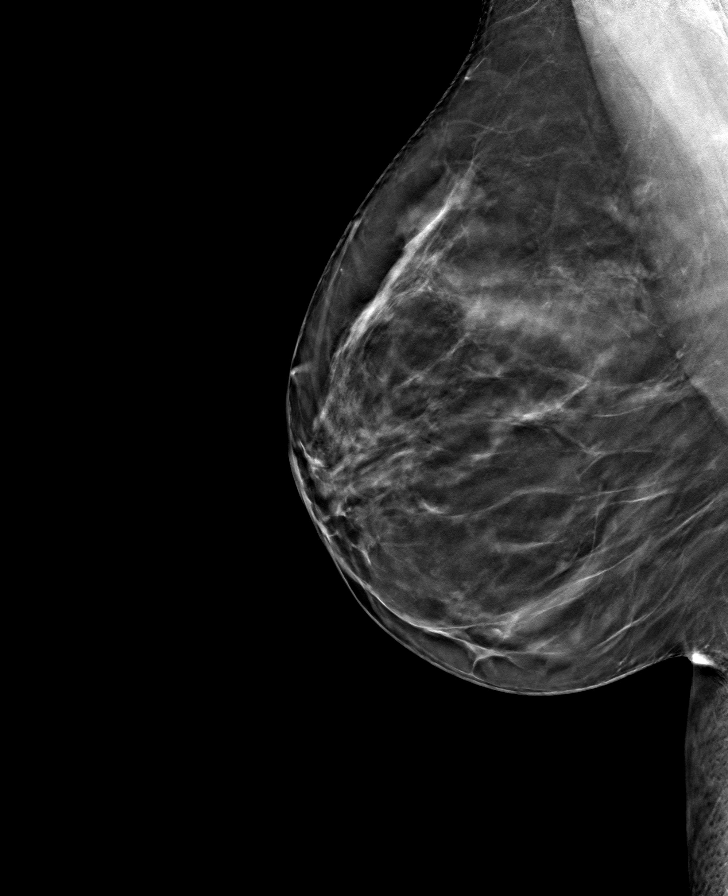

[R CC tomo · tomo slice 39/76.0]
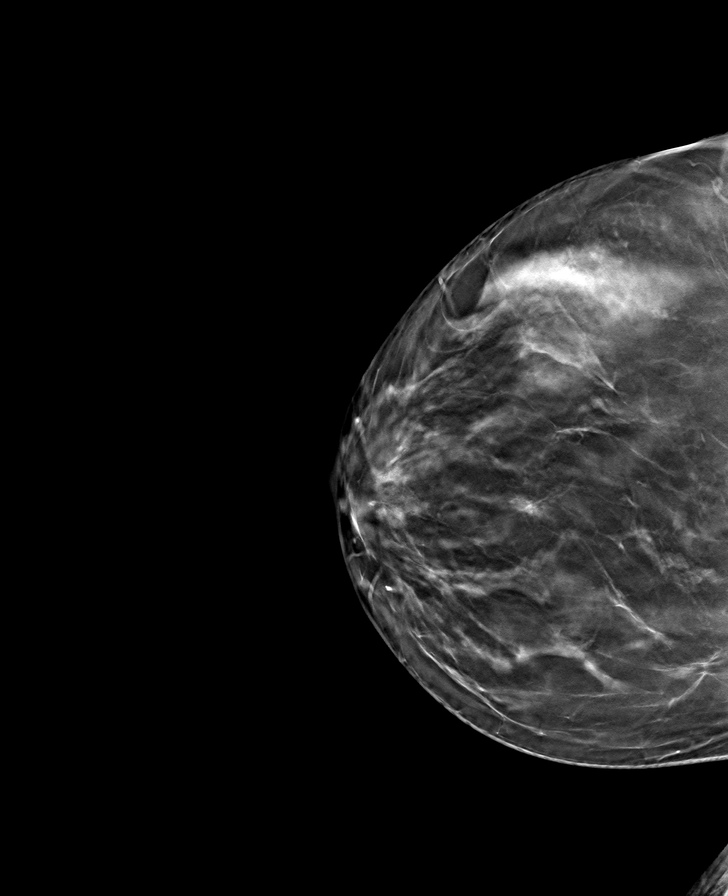

[L MLO tomo · tomo slice 37/73.0]
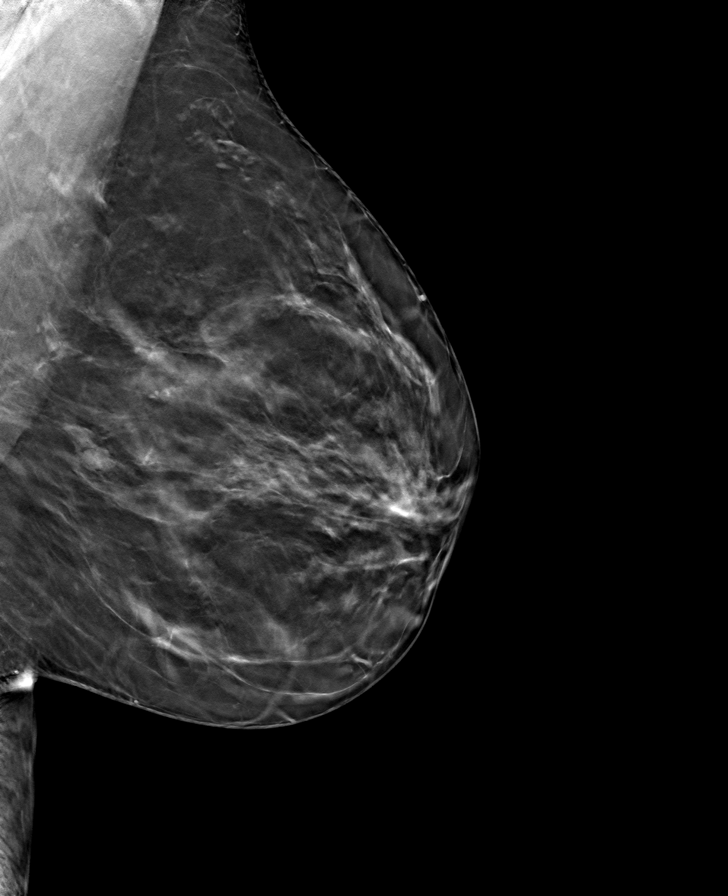

[8 of 24 positions shown; findings below may reference images not displayed]

ACR Breast Density Category c: The breast tissue is heterogeneously
dense, which may obscure small masses.
FINDINGS: There are no findings suspicious for malignancy.
IMPRESSION: No mammographic evidence of malignancy. A result letter of this
screening mammogram will be mailed directly to the patient.

RECOMMENDATION:
Screening mammogram in one year. (Code:Q3-W-BC3)

BI-RADS CATEGORY  1: Negative.

## 2023-02-08 ENCOUNTER — Encounter: Payer: BC Managed Care – PPO | Admitting: Family
